# Patient Record
Sex: Female | Born: 1950 | Race: White | Hispanic: No | Marital: Married | Smoking: Former smoker
Health system: Southern US, Community
[De-identification: ages and names within clinical notes are randomized; demographics above are authoritative.]

## PROBLEM LIST (undated history)

## (undated) DIAGNOSIS — T7840XA Allergy, unspecified, initial encounter: Secondary | ICD-10-CM

## (undated) DIAGNOSIS — I499 Cardiac arrhythmia, unspecified: Secondary | ICD-10-CM

## (undated) DIAGNOSIS — M1990 Unspecified osteoarthritis, unspecified site: Secondary | ICD-10-CM

## (undated) DIAGNOSIS — R0989 Other specified symptoms and signs involving the circulatory and respiratory systems: Secondary | ICD-10-CM

## (undated) DIAGNOSIS — Z90710 Acquired absence of both cervix and uterus: Secondary | ICD-10-CM

## (undated) DIAGNOSIS — M8580 Other specified disorders of bone density and structure, unspecified site: Secondary | ICD-10-CM

## (undated) DIAGNOSIS — E079 Disorder of thyroid, unspecified: Secondary | ICD-10-CM

## (undated) DIAGNOSIS — I10 Essential (primary) hypertension: Secondary | ICD-10-CM

## (undated) HISTORY — DX: Essential (primary) hypertension: I10

## (undated) HISTORY — DX: Other specified disorders of bone density and structure, unspecified site: M85.80

## (undated) HISTORY — DX: Disorder of thyroid, unspecified: E07.9

## (undated) HISTORY — DX: Other specified symptoms and signs involving the circulatory and respiratory systems: R09.89

## (undated) HISTORY — DX: Cardiac arrhythmia, unspecified: I49.9

## (undated) HISTORY — DX: Unspecified osteoarthritis, unspecified site: M19.90

## (undated) HISTORY — DX: Allergy, unspecified, initial encounter: T78.40XA

## (undated) HISTORY — DX: Acquired absence of both cervix and uterus: Z90.710

---

## 2000-03-22 ENCOUNTER — Other Ambulatory Visit: Admission: RE | Admit: 2000-03-22 | Discharge: 2000-03-22 | Payer: Self-pay | Admitting: Obstetrics and Gynecology

## 2003-02-18 ENCOUNTER — Encounter: Admission: RE | Admit: 2003-02-18 | Discharge: 2003-02-18 | Payer: Self-pay | Admitting: Family Medicine

## 2003-09-10 ENCOUNTER — Encounter: Admission: RE | Admit: 2003-09-10 | Discharge: 2003-09-10 | Payer: Self-pay | Admitting: Family Medicine

## 2003-09-22 ENCOUNTER — Encounter: Admission: RE | Admit: 2003-09-22 | Discharge: 2003-09-22 | Payer: Self-pay | Admitting: Family Medicine

## 2004-12-14 ENCOUNTER — Encounter: Admission: RE | Admit: 2004-12-14 | Discharge: 2004-12-14 | Payer: Self-pay | Admitting: Family Medicine

## 2006-03-06 ENCOUNTER — Encounter (HOSPITAL_COMMUNITY): Admission: RE | Admit: 2006-03-06 | Discharge: 2006-06-04 | Payer: Self-pay | Admitting: Family Medicine

## 2006-05-02 ENCOUNTER — Encounter: Admission: RE | Admit: 2006-05-02 | Discharge: 2006-05-02 | Payer: Self-pay | Admitting: Endocrinology

## 2008-08-27 ENCOUNTER — Encounter: Admission: RE | Admit: 2008-08-27 | Discharge: 2008-08-27 | Payer: Self-pay | Admitting: Family Medicine

## 2010-02-27 ENCOUNTER — Encounter: Payer: Self-pay | Admitting: Endocrinology

## 2010-09-08 ENCOUNTER — Other Ambulatory Visit: Payer: Self-pay | Admitting: Family Medicine

## 2010-09-08 DIAGNOSIS — M8580 Other specified disorders of bone density and structure, unspecified site: Secondary | ICD-10-CM

## 2010-09-08 DIAGNOSIS — Z1231 Encounter for screening mammogram for malignant neoplasm of breast: Secondary | ICD-10-CM

## 2010-09-20 ENCOUNTER — Ambulatory Visit
Admission: RE | Admit: 2010-09-20 | Discharge: 2010-09-20 | Disposition: A | Discharge status: Home / Self Care | Payer: BC Managed Care – PPO | Source: Ambulatory Visit | Attending: Family Medicine | Admitting: Family Medicine

## 2010-09-20 DIAGNOSIS — Z1231 Encounter for screening mammogram for malignant neoplasm of breast: Secondary | ICD-10-CM

## 2010-09-20 DIAGNOSIS — M8580 Other specified disorders of bone density and structure, unspecified site: Secondary | ICD-10-CM

## 2012-03-15 ENCOUNTER — Other Ambulatory Visit: Payer: Self-pay | Admitting: Family Medicine

## 2012-03-15 DIAGNOSIS — Z1231 Encounter for screening mammogram for malignant neoplasm of breast: Secondary | ICD-10-CM

## 2012-03-29 ENCOUNTER — Ambulatory Visit
Admission: RE | Admit: 2012-03-29 | Discharge: 2012-03-29 | Disposition: A | Discharge status: Home / Self Care | Payer: BC Managed Care – PPO | Source: Ambulatory Visit | Attending: Family Medicine | Admitting: Family Medicine

## 2012-03-29 DIAGNOSIS — Z1231 Encounter for screening mammogram for malignant neoplasm of breast: Secondary | ICD-10-CM

## 2012-04-06 HISTORY — PX: BREAST BIOPSY: SHX20

## 2012-04-08 ENCOUNTER — Other Ambulatory Visit: Payer: Self-pay | Admitting: Family Medicine

## 2012-04-08 DIAGNOSIS — R928 Other abnormal and inconclusive findings on diagnostic imaging of breast: Secondary | ICD-10-CM

## 2012-04-17 ENCOUNTER — Other Ambulatory Visit: Payer: Self-pay | Admitting: Family Medicine

## 2012-04-17 ENCOUNTER — Ambulatory Visit
Admission: RE | Admit: 2012-04-17 | Discharge: 2012-04-17 | Disposition: A | Discharge status: Home / Self Care | Payer: BC Managed Care – PPO | Source: Ambulatory Visit | Attending: Family Medicine | Admitting: Family Medicine

## 2012-04-17 DIAGNOSIS — R928 Other abnormal and inconclusive findings on diagnostic imaging of breast: Secondary | ICD-10-CM

## 2012-04-23 ENCOUNTER — Ambulatory Visit
Admission: RE | Admit: 2012-04-23 | Discharge: 2012-04-23 | Disposition: A | Discharge status: Home / Self Care | Payer: BC Managed Care – PPO | Source: Ambulatory Visit | Attending: Family Medicine | Admitting: Family Medicine

## 2012-04-23 ENCOUNTER — Other Ambulatory Visit: Payer: Self-pay | Admitting: Family Medicine

## 2012-04-23 DIAGNOSIS — R928 Other abnormal and inconclusive findings on diagnostic imaging of breast: Secondary | ICD-10-CM

## 2013-03-21 ENCOUNTER — Other Ambulatory Visit: Payer: Self-pay

## 2013-03-21 DIAGNOSIS — Z1231 Encounter for screening mammogram for malignant neoplasm of breast: Secondary | ICD-10-CM

## 2013-03-28 ENCOUNTER — Other Ambulatory Visit: Payer: Self-pay | Admitting: Family Medicine

## 2013-03-28 DIAGNOSIS — M8580 Other specified disorders of bone density and structure, unspecified site: Secondary | ICD-10-CM

## 2013-04-02 ENCOUNTER — Ambulatory Visit: Payer: BC Managed Care – PPO

## 2013-04-14 ENCOUNTER — Other Ambulatory Visit: Payer: BC Managed Care – PPO

## 2013-04-14 ENCOUNTER — Ambulatory Visit: Payer: BC Managed Care – PPO

## 2013-04-30 ENCOUNTER — Ambulatory Visit
Admission: RE | Admit: 2013-04-30 | Discharge: 2013-04-30 | Disposition: A | Discharge status: Home / Self Care | Payer: BC Managed Care – PPO | Source: Ambulatory Visit | Attending: Family Medicine | Admitting: Family Medicine

## 2013-04-30 ENCOUNTER — Ambulatory Visit
Admission: RE | Admit: 2013-04-30 | Discharge: 2013-04-30 | Disposition: A | Discharge status: Home / Self Care | Payer: BC Managed Care – PPO | Source: Ambulatory Visit

## 2013-04-30 DIAGNOSIS — Z1231 Encounter for screening mammogram for malignant neoplasm of breast: Secondary | ICD-10-CM

## 2013-04-30 DIAGNOSIS — M8580 Other specified disorders of bone density and structure, unspecified site: Secondary | ICD-10-CM

## 2014-04-29 ENCOUNTER — Other Ambulatory Visit: Payer: Self-pay

## 2014-04-29 DIAGNOSIS — Z1231 Encounter for screening mammogram for malignant neoplasm of breast: Secondary | ICD-10-CM

## 2014-05-04 ENCOUNTER — Ambulatory Visit
Admission: RE | Admit: 2014-05-04 | Discharge: 2014-05-04 | Disposition: A | Discharge status: Home / Self Care | Payer: BLUE CROSS/BLUE SHIELD | Source: Ambulatory Visit

## 2014-05-04 DIAGNOSIS — Z1231 Encounter for screening mammogram for malignant neoplasm of breast: Secondary | ICD-10-CM

## 2014-09-16 ENCOUNTER — Other Ambulatory Visit: Payer: Self-pay | Admitting: Physician Assistant

## 2014-09-16 ENCOUNTER — Ambulatory Visit
Admission: RE | Admit: 2014-09-16 | Discharge: 2014-09-16 | Disposition: A | Discharge status: Home / Self Care | Payer: BLUE CROSS/BLUE SHIELD | Source: Ambulatory Visit | Attending: Physician Assistant | Admitting: Physician Assistant

## 2014-09-16 DIAGNOSIS — W5501XA Bitten by cat, initial encounter: Secondary | ICD-10-CM

## 2014-09-29 ENCOUNTER — Encounter (HOSPITAL_COMMUNITY): Payer: Self-pay | Admitting: *Deleted

## 2014-09-29 ENCOUNTER — Emergency Department (HOSPITAL_COMMUNITY)
Admission: EM | Admit: 2014-09-29 | Discharge: 2014-09-29 | Disposition: A | Discharge status: Home / Self Care | Payer: BLUE CROSS/BLUE SHIELD | Attending: Emergency Medicine | Admitting: Emergency Medicine

## 2014-09-29 DIAGNOSIS — E039 Hypothyroidism, unspecified: Secondary | ICD-10-CM | POA: Insufficient documentation

## 2014-09-29 DIAGNOSIS — M1990 Unspecified osteoarthritis, unspecified site: Secondary | ICD-10-CM | POA: Diagnosis not present

## 2014-09-29 DIAGNOSIS — I10 Essential (primary) hypertension: Secondary | ICD-10-CM | POA: Insufficient documentation

## 2014-09-29 DIAGNOSIS — R55 Syncope and collapse: Secondary | ICD-10-CM | POA: Insufficient documentation

## 2014-09-29 DIAGNOSIS — Z79899 Other long term (current) drug therapy: Secondary | ICD-10-CM | POA: Diagnosis not present

## 2014-09-29 DIAGNOSIS — Z7982 Long term (current) use of aspirin: Secondary | ICD-10-CM | POA: Insufficient documentation

## 2014-09-29 DIAGNOSIS — Z720 Tobacco use: Secondary | ICD-10-CM | POA: Insufficient documentation

## 2014-09-29 LAB — URINALYSIS, ROUTINE W REFLEX MICROSCOPIC
Bilirubin Urine: NEGATIVE
GLUCOSE, UA: NEGATIVE mg/dL
Hgb urine dipstick: NEGATIVE
Ketones, ur: NEGATIVE mg/dL
LEUKOCYTES UA: NEGATIVE
Nitrite: NEGATIVE
PROTEIN: NEGATIVE mg/dL
SPECIFIC GRAVITY, URINE: 1.007 (ref 1.005–1.030)
Urobilinogen, UA: 0.2 mg/dL (ref 0.0–1.0)
pH: 7.5 (ref 5.0–8.0)

## 2014-09-29 LAB — BASIC METABOLIC PANEL
ANION GAP: 9 (ref 5–15)
BUN: 8 mg/dL (ref 6–20)
CALCIUM: 9.7 mg/dL (ref 8.9–10.3)
CHLORIDE: 100 mmol/L — AB (ref 101–111)
CO2: 29 mmol/L (ref 22–32)
Creatinine, Ser: 0.61 mg/dL (ref 0.44–1.00)
GFR calc non Af Amer: >60 mL/min (ref >60)
Glucose, Bld: 107 mg/dL — ABNORMAL HIGH (ref 65–99)
POTASSIUM: 4.2 mmol/L (ref 3.5–5.1)
Sodium: 138 mmol/L (ref 135–145)

## 2014-09-29 LAB — CBC
HEMATOCRIT: 40.9 % (ref 36.0–46.0)
HEMOGLOBIN: 14.2 g/dL (ref 12.0–15.0)
MCH: 31.6 pg (ref 26.0–34.0)
MCHC: 34.7 g/dL (ref 30.0–36.0)
MCV: 90.9 fL (ref 78.0–100.0)
Platelets: 314 10*3/uL (ref 150–400)
RBC: 4.5 MIL/uL (ref 3.87–5.11)
RDW: 13.6 % (ref 11.5–15.5)
WBC: 5.4 10*3/uL (ref 4.0–10.5)

## 2014-09-29 LAB — CBG MONITORING, ED: Glucose-Capillary: 105 mg/dL — ABNORMAL HIGH (ref 65–99)

## 2014-09-29 NOTE — ED Notes (Signed)
Dr. Adela Lank at bedside with the patient and family.

## 2014-09-29 NOTE — ED Notes (Signed)
Pt states that she was sitting on the toilet and got lightheaded. Pt states that she laid down and started feeling better however when she stood up she felt lightheaded again. Pt reports generalized weakness and "shaky". Neuro intact in triage.

## 2014-09-29 NOTE — Discharge Instructions (Signed)
Near-Syncope Near-syncope (commonly known as near fainting) is sudden weakness, dizziness, or feeling like you might pass out. During an episode of near-syncope, you may also develop pale skin, have tunnel vision, or feel sick to your stomach (nauseous). Near-syncope may occur when getting up after sitting or while standing for a long time. It is caused by a sudden decrease in blood flow to the brain. This decrease can result from various causes or triggers, most of which are not serious. However, because near-syncope can sometimes be a sign of something serious, a medical evaluation is required. The specific cause is often not determined. HOME CARE INSTRUCTIONS  Monitor your condition for any changes. The following actions may help to alleviate any discomfort you are experiencing:  Have someone stay with you until you feel stable.  Lie down right away and prop your feet up if you start feeling like you might faint. Breathe deeply and steadily. Wait until all the symptoms have passed. Most of these episodes last only a few minutes. You may feel tired for several hours.   Drink enough fluids to keep your urine clear or pale yellow.   If you are taking blood pressure or heart medicine, get up slowly when seated or lying down. Take several minutes to sit and then stand. This can reduce dizziness.  Follow up with your health care provider as directed. SEEK IMMEDIATE MEDICAL CARE IF:   You have a severe headache.   You have unusual pain in the chest, abdomen, or back.   You are bleeding from the mouth or rectum, or you have black or tarry stool.   You have an irregular or very fast heartbeat.   You have repeated fainting or have seizure-like jerking during an episode.   You faint when sitting or lying down.   You have confusion.   You have difficulty walking.   You have severe weakness.   You have vision problems.  MAKE SURE YOU:   Understand these instructions.  Will  watch your condition.  Will get help right away if you are not doing well or get worse. Document Released: 01/23/2005 Document Revised: 01/28/2013 Document Reviewed: 06/28/2012 Exit[employer] Patient Information 2015 [employer]. This information is not intended to replace advice given to you by your health care provider. Make sure you discuss any questions you have with your health care provider.  

## 2014-09-29 NOTE — ED Provider Notes (Signed)
CSN: [id number]     Arrival date & time 09/29/14  1308 History   First MD Initiated Contact with Patient 09/29/14 [phone]     Chief Complaint  Patient presents with  . Near Syncope   HPI  64yo female presenting with near syncope. Denies any loss of consciousness. Noted first episode occurred two weeks ago and she went to her PCP's office; blood pressure was noted to be significantly elevated, but had improved prior to going home. Had recently fallen down a flight of stairs and hurt her ankle, so blood pressure was believed to be elevated because of this. Was started on Augmentin for infected cat bite on right arm and on Meloxicam for back and ankle pain follow fall. Did not like the way the medications made her feel, so she discontinued these only a few days after starting them. Denies any other episodes of weakness or dizziness, but husband states she told him several times over the last few weeks that she felt weak. This morning Lisa Owen was getting ready when she became very lightheaded, flushed and hot, and weak. She laid down and the symptoms seemed to improve, however every time she stood up the symptoms returned. Has not eaten yet this morning. States she has been taking her blood pressure several times a day since she was seen in her PCPs office and it has been in 120s-130s/70s. Also noted some nausea with today's episode, but denies vomiting.   Past Medical History  Diagnosis Date  . Hypertension   . Arrhythmia     tacycardia   . Thyroid disease     hypothyroidism  . Allergy   . Osteopenia   . Carotid bruit     doppler normal 09/2009  . Arthritis     (Thumb CMCs bil)  . Hx of hysterectomy     secondary endometriosis/ left oorphorectomy secondary endometriosis   History reviewed. No pertinent past surgical history. Family History  Problem Relation Age of Onset  . Arthritis Mother   . Lung cancer Father   . CVA Sister    Social History  Substance Use Topics  . Smoking status:  Current Some Day Smoker  . Smokeless tobacco: None  . Alcohol Use: No   OB History    No data available     Review of Systems    Allergies  Boniva; Clarinex; Fosamax; and Sudafed  Home Medications   Prior to Admission medications   Medication Sig Start Date End Date Taking? Authorizing Provider  aspirin EC 81 MG tablet Take 81 mg by mouth daily.    Historical Provider, MD  calcium gluconate 500 MG tablet Take 1 tablet by mouth 3 (three) times daily.    Historical Provider, MD  cholecalciferol (VITAMIN D) 1000 UNITS tablet Take 1,000 Units by mouth daily.    Historical Provider, MD  Fluticasone-Salmeterol (ADVAIR DISKUS IN) Inhale into the lungs. Twice a day    Historical Provider, MD  folic acid (FOLVITE) 400 MCG tablet Take 400 mcg by mouth daily.    Historical Provider, MD  guaiFENesin (MUCINEX) 600 MG 12 hr tablet Over the counter  As needed    Historical Provider, MD  hydrochlorothiazide (HYDRODIURIL) 25 MG tablet Take 25 mg by mouth daily.    Historical Provider, MD  Multiple Vitamins-Minerals (CENTRUM SILVER PO) Take by mouth. 1 tab daily    Historical Provider, MD  vitamin C (ASCORBIC ACID) 500 MG tablet Take 500 mg by mouth daily.    Historical Provider,  MD   BP 131/56 mmHg  Pulse 73  Temp(Src) 99.1 F (37.3 C) (Oral)  Resp 22  SpO2 94% Physical Exam  ED Course  Procedures (including critical care time) Labs Review Labs Reviewed  BASIC METABOLIC PANEL - Abnormal; Notable for the following:    Chloride 100 (*)    Glucose, Bld 107 (*)    All other components within normal limits  CBG MONITORING, ED - Abnormal; Notable for the following:    Glucose-Capillary 105 (*)    All other components within normal limits  CBC  URINALYSIS, ROUTINE W REFLEX MICROSCOPIC (NOT AT [facility])    Imaging Review No results found. I have personally reviewed and evaluated these images and lab results as part of my medical decision-making.   EKG Interpretation   Date/Time:   Tuesday September 29 2014 09:01:14 EDT Ventricular Rate:  83 PR Interval:  174 QRS Duration: 76 QT Interval:  362 QTC Calculation: 425 R Axis:   83 Text Interpretation:  Normal sinus rhythm Normal ECG No previous ECGs  available Confirmed by FLOYD MD, DANIEL ([id number]) on 09/29/2014 10:25:08 AM      MDM   Final diagnoses:  Near syncope  EKG with NSR. Urinalysis normal.  BMP normal. CBC normal. Repeat EKG with NSR. No orthostasis noted. Denies symptoms of lightheadedness prior to discharge. Stable for discharge with follow up with PCP.    [place], [place] 09/29/14 1101  Melene Plan, DO 09/29/14 1524

## 2014-09-29 NOTE — ED Notes (Signed)
CBG 105 

## 2015-04-28 ENCOUNTER — Other Ambulatory Visit: Payer: Self-pay | Admitting: Family Medicine

## 2015-04-28 DIAGNOSIS — Z1231 Encounter for screening mammogram for malignant neoplasm of breast: Secondary | ICD-10-CM

## 2015-04-28 DIAGNOSIS — M8580 Other specified disorders of bone density and structure, unspecified site: Secondary | ICD-10-CM

## 2015-05-20 ENCOUNTER — Ambulatory Visit: Payer: BLUE CROSS/BLUE SHIELD

## 2015-05-20 ENCOUNTER — Other Ambulatory Visit: Payer: BLUE CROSS/BLUE SHIELD

## 2015-05-27 ENCOUNTER — Ambulatory Visit
Admission: RE | Admit: 2015-05-27 | Discharge: 2015-05-27 | Disposition: A | Discharge status: Home / Self Care | Payer: BLUE CROSS/BLUE SHIELD | Source: Ambulatory Visit | Attending: Family Medicine | Admitting: Family Medicine

## 2015-05-27 DIAGNOSIS — Z780 Asymptomatic menopausal state: Secondary | ICD-10-CM | POA: Diagnosis not present

## 2015-05-27 DIAGNOSIS — Z1231 Encounter for screening mammogram for malignant neoplasm of breast: Secondary | ICD-10-CM | POA: Diagnosis not present

## 2015-05-27 DIAGNOSIS — M85852 Other specified disorders of bone density and structure, left thigh: Secondary | ICD-10-CM | POA: Diagnosis not present

## 2015-05-27 DIAGNOSIS — M8580 Other specified disorders of bone density and structure, unspecified site: Secondary | ICD-10-CM

## 2015-10-05 DIAGNOSIS — I10 Essential (primary) hypertension: Secondary | ICD-10-CM | POA: Diagnosis not present

## 2015-10-05 DIAGNOSIS — Z23 Encounter for immunization: Secondary | ICD-10-CM | POA: Diagnosis not present

## 2015-10-05 DIAGNOSIS — E559 Vitamin D deficiency, unspecified: Secondary | ICD-10-CM | POA: Diagnosis not present

## 2015-10-05 DIAGNOSIS — Z1211 Encounter for screening for malignant neoplasm of colon: Secondary | ICD-10-CM | POA: Diagnosis not present

## 2015-10-05 DIAGNOSIS — E7800 Pure hypercholesterolemia, unspecified: Secondary | ICD-10-CM | POA: Diagnosis not present

## 2015-10-05 DIAGNOSIS — E039 Hypothyroidism, unspecified: Secondary | ICD-10-CM | POA: Diagnosis not present

## 2015-10-05 DIAGNOSIS — E049 Nontoxic goiter, unspecified: Secondary | ICD-10-CM | POA: Diagnosis not present

## 2016-04-05 DIAGNOSIS — E7800 Pure hypercholesterolemia, unspecified: Secondary | ICD-10-CM | POA: Diagnosis not present

## 2016-04-05 DIAGNOSIS — E039 Hypothyroidism, unspecified: Secondary | ICD-10-CM | POA: Diagnosis not present

## 2016-04-05 DIAGNOSIS — E049 Nontoxic goiter, unspecified: Secondary | ICD-10-CM | POA: Diagnosis not present

## 2016-04-05 DIAGNOSIS — I10 Essential (primary) hypertension: Secondary | ICD-10-CM | POA: Diagnosis not present

## 2016-04-05 DIAGNOSIS — M8580 Other specified disorders of bone density and structure, unspecified site: Secondary | ICD-10-CM | POA: Diagnosis not present

## 2016-04-05 DIAGNOSIS — E559 Vitamin D deficiency, unspecified: Secondary | ICD-10-CM | POA: Diagnosis not present

## 2016-07-07 ENCOUNTER — Other Ambulatory Visit: Payer: Self-pay | Admitting: Family Medicine

## 2016-07-07 DIAGNOSIS — Z1231 Encounter for screening mammogram for malignant neoplasm of breast: Secondary | ICD-10-CM

## 2016-07-10 DIAGNOSIS — M545 Low back pain: Secondary | ICD-10-CM | POA: Diagnosis not present

## 2016-07-10 DIAGNOSIS — M5417 Radiculopathy, lumbosacral region: Secondary | ICD-10-CM | POA: Diagnosis not present

## 2016-07-11 DIAGNOSIS — M545 Low back pain: Secondary | ICD-10-CM | POA: Diagnosis not present

## 2016-07-11 DIAGNOSIS — M5417 Radiculopathy, lumbosacral region: Secondary | ICD-10-CM | POA: Diagnosis not present

## 2016-07-14 DIAGNOSIS — M545 Low back pain: Secondary | ICD-10-CM | POA: Diagnosis not present

## 2016-07-14 DIAGNOSIS — M5417 Radiculopathy, lumbosacral region: Secondary | ICD-10-CM | POA: Diagnosis not present

## 2016-07-17 DIAGNOSIS — M5417 Radiculopathy, lumbosacral region: Secondary | ICD-10-CM | POA: Diagnosis not present

## 2016-07-17 DIAGNOSIS — M545 Low back pain: Secondary | ICD-10-CM | POA: Diagnosis not present

## 2016-07-20 DIAGNOSIS — M545 Low back pain: Secondary | ICD-10-CM | POA: Diagnosis not present

## 2016-07-20 DIAGNOSIS — M5417 Radiculopathy, lumbosacral region: Secondary | ICD-10-CM | POA: Diagnosis not present

## 2016-07-24 DIAGNOSIS — M5417 Radiculopathy, lumbosacral region: Secondary | ICD-10-CM | POA: Diagnosis not present

## 2016-07-24 DIAGNOSIS — M545 Low back pain: Secondary | ICD-10-CM | POA: Diagnosis not present

## 2016-07-25 DIAGNOSIS — M5417 Radiculopathy, lumbosacral region: Secondary | ICD-10-CM | POA: Diagnosis not present

## 2016-07-25 DIAGNOSIS — M545 Low back pain: Secondary | ICD-10-CM | POA: Diagnosis not present

## 2016-07-27 ENCOUNTER — Ambulatory Visit
Admission: RE | Admit: 2016-07-27 | Discharge: 2016-07-27 | Disposition: A | Discharge status: Home / Self Care | Payer: BLUE CROSS/BLUE SHIELD | Source: Ambulatory Visit | Attending: Family Medicine | Admitting: Family Medicine

## 2016-07-27 DIAGNOSIS — Z1231 Encounter for screening mammogram for malignant neoplasm of breast: Secondary | ICD-10-CM | POA: Diagnosis not present

## 2016-08-04 DIAGNOSIS — M5417 Radiculopathy, lumbosacral region: Secondary | ICD-10-CM | POA: Diagnosis not present

## 2016-08-04 DIAGNOSIS — M545 Low back pain: Secondary | ICD-10-CM | POA: Diagnosis not present

## 2016-08-08 DIAGNOSIS — M545 Low back pain: Secondary | ICD-10-CM | POA: Diagnosis not present

## 2016-08-08 DIAGNOSIS — M5417 Radiculopathy, lumbosacral region: Secondary | ICD-10-CM | POA: Diagnosis not present

## 2016-08-22 DIAGNOSIS — M5417 Radiculopathy, lumbosacral region: Secondary | ICD-10-CM | POA: Diagnosis not present

## 2016-08-22 DIAGNOSIS — M545 Low back pain: Secondary | ICD-10-CM | POA: Diagnosis not present

## 2016-10-04 DIAGNOSIS — R7301 Impaired fasting glucose: Secondary | ICD-10-CM | POA: Diagnosis not present

## 2016-10-04 DIAGNOSIS — E039 Hypothyroidism, unspecified: Secondary | ICD-10-CM | POA: Diagnosis not present

## 2016-10-04 DIAGNOSIS — I10 Essential (primary) hypertension: Secondary | ICD-10-CM | POA: Diagnosis not present

## 2016-10-04 DIAGNOSIS — E049 Nontoxic goiter, unspecified: Secondary | ICD-10-CM | POA: Diagnosis not present

## 2016-10-04 DIAGNOSIS — E7800 Pure hypercholesterolemia, unspecified: Secondary | ICD-10-CM | POA: Diagnosis not present

## 2016-10-04 DIAGNOSIS — Z1211 Encounter for screening for malignant neoplasm of colon: Secondary | ICD-10-CM | POA: Diagnosis not present

## 2016-10-04 DIAGNOSIS — E559 Vitamin D deficiency, unspecified: Secondary | ICD-10-CM | POA: Diagnosis not present

## 2017-01-22 DIAGNOSIS — F411 Generalized anxiety disorder: Secondary | ICD-10-CM | POA: Diagnosis not present

## 2017-02-19 DIAGNOSIS — F411 Generalized anxiety disorder: Secondary | ICD-10-CM | POA: Diagnosis not present

## 2017-03-19 DIAGNOSIS — E559 Vitamin D deficiency, unspecified: Secondary | ICD-10-CM | POA: Diagnosis not present

## 2017-03-19 DIAGNOSIS — F411 Generalized anxiety disorder: Secondary | ICD-10-CM | POA: Diagnosis not present

## 2017-03-19 DIAGNOSIS — E039 Hypothyroidism, unspecified: Secondary | ICD-10-CM | POA: Diagnosis not present

## 2017-03-19 DIAGNOSIS — R7301 Impaired fasting glucose: Secondary | ICD-10-CM | POA: Diagnosis not present

## 2017-03-19 DIAGNOSIS — I10 Essential (primary) hypertension: Secondary | ICD-10-CM | POA: Diagnosis not present

## 2017-03-19 DIAGNOSIS — E049 Nontoxic goiter, unspecified: Secondary | ICD-10-CM | POA: Diagnosis not present

## 2017-09-10 ENCOUNTER — Other Ambulatory Visit: Payer: Self-pay | Admitting: Family Medicine

## 2017-09-10 DIAGNOSIS — Z1231 Encounter for screening mammogram for malignant neoplasm of breast: Secondary | ICD-10-CM

## 2017-09-26 DIAGNOSIS — Z23 Encounter for immunization: Secondary | ICD-10-CM | POA: Diagnosis not present

## 2017-09-26 DIAGNOSIS — E039 Hypothyroidism, unspecified: Secondary | ICD-10-CM | POA: Diagnosis not present

## 2017-09-26 DIAGNOSIS — R7301 Impaired fasting glucose: Secondary | ICD-10-CM | POA: Diagnosis not present

## 2017-09-26 DIAGNOSIS — E7800 Pure hypercholesterolemia, unspecified: Secondary | ICD-10-CM | POA: Diagnosis not present

## 2017-09-26 DIAGNOSIS — I10 Essential (primary) hypertension: Secondary | ICD-10-CM | POA: Diagnosis not present

## 2017-09-26 DIAGNOSIS — F411 Generalized anxiety disorder: Secondary | ICD-10-CM | POA: Diagnosis not present

## 2017-09-26 DIAGNOSIS — Z1211 Encounter for screening for malignant neoplasm of colon: Secondary | ICD-10-CM | POA: Diagnosis not present

## 2017-09-26 DIAGNOSIS — E559 Vitamin D deficiency, unspecified: Secondary | ICD-10-CM | POA: Diagnosis not present

## 2017-10-05 ENCOUNTER — Other Ambulatory Visit: Payer: Self-pay | Admitting: Family Medicine

## 2017-10-05 ENCOUNTER — Ambulatory Visit
Admission: RE | Admit: 2017-10-05 | Discharge: 2017-10-05 | Disposition: A | Discharge status: Home / Self Care | Payer: BLUE CROSS/BLUE SHIELD | Source: Ambulatory Visit | Attending: Family Medicine | Admitting: Family Medicine

## 2017-10-05 DIAGNOSIS — Z1231 Encounter for screening mammogram for malignant neoplasm of breast: Secondary | ICD-10-CM

## 2017-10-05 DIAGNOSIS — M8580 Other specified disorders of bone density and structure, unspecified site: Secondary | ICD-10-CM

## 2017-12-05 ENCOUNTER — Ambulatory Visit
Admission: RE | Admit: 2017-12-05 | Discharge: 2017-12-05 | Disposition: A | Discharge status: Home / Self Care | Payer: BLUE CROSS/BLUE SHIELD | Source: Ambulatory Visit | Attending: Family Medicine | Admitting: Family Medicine

## 2017-12-05 DIAGNOSIS — Z780 Asymptomatic menopausal state: Secondary | ICD-10-CM | POA: Diagnosis not present

## 2017-12-05 DIAGNOSIS — M8589 Other specified disorders of bone density and structure, multiple sites: Secondary | ICD-10-CM | POA: Diagnosis not present

## 2017-12-05 DIAGNOSIS — M8580 Other specified disorders of bone density and structure, unspecified site: Secondary | ICD-10-CM

## 2018-04-08 DIAGNOSIS — E049 Nontoxic goiter, unspecified: Secondary | ICD-10-CM | POA: Diagnosis not present

## 2018-04-08 DIAGNOSIS — F411 Generalized anxiety disorder: Secondary | ICD-10-CM | POA: Diagnosis not present

## 2018-04-08 DIAGNOSIS — R7301 Impaired fasting glucose: Secondary | ICD-10-CM | POA: Diagnosis not present

## 2018-04-08 DIAGNOSIS — I10 Essential (primary) hypertension: Secondary | ICD-10-CM | POA: Diagnosis not present

## 2018-04-08 DIAGNOSIS — E7800 Pure hypercholesterolemia, unspecified: Secondary | ICD-10-CM | POA: Diagnosis not present

## 2018-04-08 DIAGNOSIS — E559 Vitamin D deficiency, unspecified: Secondary | ICD-10-CM | POA: Diagnosis not present

## 2018-04-08 DIAGNOSIS — E039 Hypothyroidism, unspecified: Secondary | ICD-10-CM | POA: Diagnosis not present

## 2020-07-26 ENCOUNTER — Other Ambulatory Visit: Payer: Self-pay

## 2020-07-26 ENCOUNTER — Emergency Department (HOSPITAL_COMMUNITY): Payer: Medicare Other

## 2020-07-26 ENCOUNTER — Encounter (HOSPITAL_COMMUNITY): Payer: Self-pay | Admitting: *Deleted

## 2020-07-26 ENCOUNTER — Inpatient Hospital Stay (HOSPITAL_COMMUNITY)
Admission: EM | Admit: 2020-07-26 | Discharge: 2020-08-03 | DRG: 871 | Disposition: A | Discharge status: Home Health | Payer: Medicare Other | Attending: Hospitalist | Admitting: Hospitalist

## 2020-07-26 DIAGNOSIS — R0602 Shortness of breath: Secondary | ICD-10-CM

## 2020-07-26 DIAGNOSIS — Z79899 Other long term (current) drug therapy: Secondary | ICD-10-CM

## 2020-07-26 DIAGNOSIS — Z20822 Contact with and (suspected) exposure to covid-19: Secondary | ICD-10-CM | POA: Diagnosis present

## 2020-07-26 DIAGNOSIS — J4531 Mild persistent asthma with (acute) exacerbation: Secondary | ICD-10-CM | POA: Diagnosis present

## 2020-07-26 DIAGNOSIS — Z7951 Long term (current) use of inhaled steroids: Secondary | ICD-10-CM | POA: Diagnosis not present

## 2020-07-26 DIAGNOSIS — R6520 Severe sepsis without septic shock: Secondary | ICD-10-CM | POA: Diagnosis not present

## 2020-07-26 DIAGNOSIS — Z7722 Contact with and (suspected) exposure to environmental tobacco smoke (acute) (chronic): Secondary | ICD-10-CM | POA: Diagnosis present

## 2020-07-26 DIAGNOSIS — A419 Sepsis, unspecified organism: Secondary | ICD-10-CM | POA: Diagnosis not present

## 2020-07-26 DIAGNOSIS — Z803 Family history of malignant neoplasm of breast: Secondary | ICD-10-CM | POA: Diagnosis not present

## 2020-07-26 DIAGNOSIS — Z7982 Long term (current) use of aspirin: Secondary | ICD-10-CM | POA: Diagnosis not present

## 2020-07-26 DIAGNOSIS — J14 Pneumonia due to Hemophilus influenzae: Secondary | ICD-10-CM | POA: Diagnosis present

## 2020-07-26 DIAGNOSIS — F419 Anxiety disorder, unspecified: Secondary | ICD-10-CM | POA: Diagnosis present

## 2020-07-26 DIAGNOSIS — M8580 Other specified disorders of bone density and structure, unspecified site: Secondary | ICD-10-CM | POA: Diagnosis present

## 2020-07-26 DIAGNOSIS — Z801 Family history of malignant neoplasm of trachea, bronchus and lung: Secondary | ICD-10-CM

## 2020-07-26 DIAGNOSIS — I10 Essential (primary) hypertension: Secondary | ICD-10-CM | POA: Diagnosis present

## 2020-07-26 DIAGNOSIS — R7881 Bacteremia: Secondary | ICD-10-CM | POA: Diagnosis not present

## 2020-07-26 DIAGNOSIS — R0902 Hypoxemia: Secondary | ICD-10-CM | POA: Diagnosis not present

## 2020-07-26 DIAGNOSIS — J9602 Acute respiratory failure with hypercapnia: Secondary | ICD-10-CM | POA: Diagnosis present

## 2020-07-26 DIAGNOSIS — Z823 Family history of stroke: Secondary | ICD-10-CM

## 2020-07-26 DIAGNOSIS — Z8261 Family history of arthritis: Secondary | ICD-10-CM | POA: Diagnosis not present

## 2020-07-26 DIAGNOSIS — A413 Sepsis due to Hemophilus influenzae: Secondary | ICD-10-CM | POA: Diagnosis not present

## 2020-07-26 DIAGNOSIS — J45901 Unspecified asthma with (acute) exacerbation: Secondary | ICD-10-CM

## 2020-07-26 DIAGNOSIS — J9601 Acute respiratory failure with hypoxia: Secondary | ICD-10-CM

## 2020-07-26 DIAGNOSIS — E871 Hypo-osmolality and hyponatremia: Secondary | ICD-10-CM | POA: Diagnosis present

## 2020-07-26 DIAGNOSIS — J189 Pneumonia, unspecified organism: Secondary | ICD-10-CM | POA: Diagnosis present

## 2020-07-26 LAB — COMPREHENSIVE METABOLIC PANEL
ALT: 29 U/L (ref 0–44)
AST: 26 U/L (ref 15–41)
Albumin: 2.9 g/dL — ABNORMAL LOW (ref 3.5–5.0)
Alkaline Phosphatase: 98 U/L (ref 38–126)
Anion gap: 11 (ref 5–15)
BUN: 23 mg/dL (ref 8–23)
CO2: 27 mmol/L (ref 22–32)
Calcium: 9.1 mg/dL (ref 8.9–10.3)
Chloride: 91 mmol/L — ABNORMAL LOW (ref 98–111)
Creatinine, Ser: 0.82 mg/dL (ref 0.44–1.00)
GFR, Estimated: >60 mL/min (ref >60)
Glucose, Bld: 139 mg/dL — ABNORMAL HIGH (ref 70–99)
Potassium: 3.5 mmol/L (ref 3.5–5.1)
Sodium: 129 mmol/L — ABNORMAL LOW (ref 135–145)
Total Bilirubin: 0.8 mg/dL (ref 0.3–1.2)
Total Protein: 7.2 g/dL (ref 6.5–8.1)

## 2020-07-26 LAB — BLOOD GAS, VENOUS
Acid-Base Excess: 0.1 mmol/L (ref 0.0–2.0)
Acid-Base Excess: 3 mmol/L — ABNORMAL HIGH (ref 0.0–2.0)
Bicarbonate: 27.7 mmol/L (ref 20.0–28.0)
Bicarbonate: 30.5 mmol/L — ABNORMAL HIGH (ref 20.0–28.0)
Drawn by: 6344
FIO2: 100
O2 Saturation: 98.2 %
O2 Saturation: 56.1 %
Patient temperature: 36.8
Patient temperature: 37
pCO2, Ven: 77.2 mmHg (ref 44.0–60.0)
pCO2, Ven: 81.7 mmHg (ref 44.0–60.0)
pH, Ven: 7.179 — CL (ref 7.250–7.430)
pH, Ven: 7.197 — CL (ref 7.250–7.430)
pO2, Ven: 138 mmHg — ABNORMAL HIGH (ref 32.0–45.0)
pO2, Ven: 32.3 mmHg (ref 32.0–45.0)

## 2020-07-26 LAB — I-STAT CHEM 8, ED
BUN: 31 mg/dL — ABNORMAL HIGH (ref 8–23)
Calcium, Ion: 1.16 mmol/L (ref 1.15–1.40)
Chloride: 94 mmol/L — ABNORMAL LOW (ref 98–111)
Creatinine, Ser: 0.8 mg/dL (ref 0.44–1.00)
Glucose, Bld: 141 mg/dL — ABNORMAL HIGH (ref 70–99)
HCT: 42 % (ref 36.0–46.0)
Hemoglobin: 14.3 g/dL (ref 12.0–15.0)
Potassium: 3.5 mmol/L (ref 3.5–5.1)
Sodium: 128 mmol/L — ABNORMAL LOW (ref 135–145)
TCO2: 31 mmol/L (ref 22–32)

## 2020-07-26 LAB — CBC WITH DIFFERENTIAL/PLATELET
Abs Immature Granulocytes: 0 10*3/uL (ref 0.00–0.07)
Basophils Absolute: 0 10*3/uL (ref 0.0–0.1)
Basophils Relative: 0 %
Eosinophils Absolute: 0 10*3/uL (ref 0.0–0.5)
Eosinophils Relative: 0 %
HCT: 38.3 % (ref 36.0–46.0)
Hemoglobin: 13 g/dL (ref 12.0–15.0)
Lymphocytes Relative: 4 %
Lymphs Abs: 0.9 10*3/uL (ref 0.7–4.0)
MCH: 31.1 pg (ref 26.0–34.0)
MCHC: 33.9 g/dL (ref 30.0–36.0)
MCV: 91.6 fL (ref 80.0–100.0)
Monocytes Absolute: 3 10*3/uL — ABNORMAL HIGH (ref 0.1–1.0)
Monocytes Relative: 13 %
Neutro Abs: 18.9 10*3/uL — ABNORMAL HIGH (ref 1.7–7.7)
Neutrophils Relative %: 83 %
Platelets: 373 10*3/uL (ref 150–400)
RBC: 4.18 MIL/uL (ref 3.87–5.11)
RDW: 14.2 % (ref 11.5–15.5)
WBC: 22.8 10*3/uL — ABNORMAL HIGH (ref 4.0–10.5)
nRBC: 0 /100 WBC
nRBC: 0.1 % (ref 0.0–0.2)

## 2020-07-26 LAB — RESP PANEL BY RT-PCR (FLU A&B, COVID) ARPGX2
Influenza A by PCR: NEGATIVE
Influenza B by PCR: NEGATIVE
SARS Coronavirus 2 by RT PCR: NEGATIVE

## 2020-07-26 LAB — LACTIC ACID, PLASMA
Lactic Acid, Venous: 1.3 mmol/L (ref 0.5–1.9)
Lactic Acid, Venous: 1.4 mmol/L (ref 0.5–1.9)

## 2020-07-26 LAB — APTT: aPTT: 30 seconds (ref 24–36)

## 2020-07-26 LAB — POC SARS CORONAVIRUS 2 AG -  ED: SARSCOV2ONAVIRUS 2 AG: NEGATIVE

## 2020-07-26 LAB — HIV ANTIBODY (ROUTINE TESTING W REFLEX): HIV Screen 4th Generation wRfx: NONREACTIVE

## 2020-07-26 LAB — PROTIME-INR
INR: 1.1 (ref 0.8–1.2)
Prothrombin Time: 14.4 seconds (ref 11.4–15.2)

## 2020-07-26 LAB — BRAIN NATRIURETIC PEPTIDE: B Natriuretic Peptide: 338.8 pg/mL — ABNORMAL HIGH (ref 0.0–100.0)

## 2020-07-26 MED ORDER — ACETAMINOPHEN 650 MG RE SUPP: 650.0000 mg | Freq: Four times a day (QID) | RECTAL | Status: DC | PRN

## 2020-07-26 MED ORDER — ALBUTEROL SULFATE (2.5 MG/3ML) 0.083% IN NEBU
5.0000 mg | INHALATION_SOLUTION | Freq: Once | RESPIRATORY_TRACT | Status: AC
  Administered 2020-07-26: 5 mg via RESPIRATORY_TRACT
  Filled 2020-07-26: qty 6

## 2020-07-26 MED ORDER — ENOXAPARIN SODIUM 40 MG/0.4ML IJ SOSY
40.0000 mg | PREFILLED_SYRINGE | Freq: Every day | INTRAMUSCULAR | Status: DC
  Administered 2020-07-26 – 2020-08-03 (×9): 40 mg via SUBCUTANEOUS
  Filled 2020-07-26 (×9): qty 0.4

## 2020-07-26 MED ORDER — LACTATED RINGERS IV SOLN: INTRAVENOUS | Status: AC

## 2020-07-26 MED ORDER — ALBUTEROL SULFATE (2.5 MG/3ML) 0.083% IN NEBU: 2.5000 mg | INHALATION_SOLUTION | RESPIRATORY_TRACT | 2 refills | Status: DC | PRN

## 2020-07-26 MED ORDER — LEVALBUTEROL HCL 0.63 MG/3ML IN NEBU: 0.6300 mg | INHALATION_SOLUTION | Freq: Four times a day (QID) | RESPIRATORY_TRACT | Status: DC | PRN

## 2020-07-26 MED ORDER — GUAIFENESIN ER 600 MG PO TB12
1200.0000 mg | ORAL_TABLET | Freq: Two times a day (BID) | ORAL | Status: DC
  Administered 2020-07-27 – 2020-07-30 (×3): 1200 mg via ORAL
  Filled 2020-07-26 (×8): qty 2

## 2020-07-26 MED ORDER — SODIUM CHLORIDE 0.9 % IV SOLN
500.0000 mg | INTRAVENOUS | Status: DC
  Administered 2020-07-26 – 2020-07-27 (×2): 500 mg via INTRAVENOUS
  Filled 2020-07-26 (×3): qty 500

## 2020-07-26 MED ORDER — METHYLPREDNISOLONE SODIUM SUCC 125 MG IJ SOLR
125.0000 mg | Freq: Once | INTRAMUSCULAR | Status: AC
  Administered 2020-07-26: 125 mg via INTRAVENOUS
  Filled 2020-07-26: qty 2

## 2020-07-26 MED ORDER — CEFTRIAXONE SODIUM 2 G IJ SOLR
2.0000 g | INTRAMUSCULAR | Status: AC
  Administered 2020-07-26 – 2020-07-31 (×6): 2 g via INTRAVENOUS
  Filled 2020-07-26 (×2): qty 2
  Filled 2020-07-26: qty 20
  Filled 2020-07-26 (×3): qty 2
  Filled 2020-07-26: qty 20

## 2020-07-26 MED ORDER — PREDNISONE 20 MG PO TABS
40.0000 mg | ORAL_TABLET | Freq: Every day | ORAL | Status: AC
  Administered 2020-07-28 – 2020-07-31 (×4): 40 mg via ORAL
  Filled 2020-07-26 (×4): qty 2

## 2020-07-26 MED ORDER — FLUTICASONE-SALMETEROL 500-50 MCG/ACT IN AEPB: 1.0000 | INHALATION_SPRAY | Freq: Two times a day (BID) | RESPIRATORY_TRACT | Status: DC

## 2020-07-26 MED ORDER — METHYLPREDNISOLONE SODIUM SUCC 40 MG IJ SOLR
40.0000 mg | Freq: Four times a day (QID) | INTRAMUSCULAR | Status: AC
  Administered 2020-07-26 – 2020-07-27 (×4): 40 mg via INTRAVENOUS
  Filled 2020-07-26 (×4): qty 1

## 2020-07-26 MED ORDER — ONDANSETRON HCL 4 MG PO TABS: 4.0000 mg | ORAL_TABLET | Freq: Four times a day (QID) | ORAL | Status: DC | PRN

## 2020-07-26 MED ORDER — AMLODIPINE BESYLATE 5 MG PO TABS
5.0000 mg | ORAL_TABLET | Freq: Every day | ORAL | Status: DC
  Administered 2020-07-26 – 2020-07-29 (×4): 5 mg via ORAL
  Filled 2020-07-26 (×4): qty 1

## 2020-07-26 MED ORDER — IPRATROPIUM-ALBUTEROL 0.5-2.5 (3) MG/3ML IN SOLN
3.0000 mL | Freq: Four times a day (QID) | RESPIRATORY_TRACT | Status: DC
  Administered 2020-07-26 – 2020-07-27 (×3): 3 mL via RESPIRATORY_TRACT
  Filled 2020-07-26 (×3): qty 3

## 2020-07-26 MED ORDER — FLUTICASONE FUROATE-VILANTEROL 200-25 MCG/INH IN AEPB
1.0000 | INHALATION_SPRAY | Freq: Every day | RESPIRATORY_TRACT | Status: DC
  Administered 2020-07-27 – 2020-08-03 (×8): 1 via RESPIRATORY_TRACT
  Filled 2020-07-26: qty 28

## 2020-07-26 MED ORDER — ALBUTEROL SULFATE HFA 108 (90 BASE) MCG/ACT IN AERS: 2.0000 | INHALATION_SPRAY | Freq: Four times a day (QID) | RESPIRATORY_TRACT | 2 refills | Status: DC | PRN

## 2020-07-26 MED ORDER — CALCIUM GLUCONATE 500 MG PO TABS: 1.0000 | ORAL_TABLET | Freq: Three times a day (TID) | ORAL | Status: DC

## 2020-07-26 MED ORDER — FOLIC ACID 1 MG PO TABS
0.5000 mg | ORAL_TABLET | Freq: Every day | ORAL | Status: DC
  Administered 2020-07-26 – 2020-08-03 (×9): 0.5 mg via ORAL
  Filled 2020-07-26 (×9): qty 1

## 2020-07-26 MED ORDER — FOLIC ACID 400 MCG PO TABS: 400.0000 ug | ORAL_TABLET | Freq: Every day | ORAL | Status: DC

## 2020-07-26 MED ORDER — ASPIRIN EC 81 MG PO TBEC
81.0000 mg | DELAYED_RELEASE_TABLET | Freq: Every day | ORAL | Status: DC
  Administered 2020-07-26 – 2020-08-03 (×9): 81 mg via ORAL
  Filled 2020-07-26 (×9): qty 1

## 2020-07-26 MED ORDER — VITAMIN D 25 MCG (1000 UNIT) PO TABS
1000.0000 [IU] | ORAL_TABLET | Freq: Every day | ORAL | Status: DC
  Administered 2020-07-27 – 2020-08-03 (×8): 1000 [IU] via ORAL
  Filled 2020-07-26 (×8): qty 1

## 2020-07-26 MED ORDER — MAGNESIUM SULFATE 2 GM/50ML IV SOLN
2.0000 g | Freq: Once | INTRAVENOUS | Status: AC
  Administered 2020-07-26: 2 g via INTRAVENOUS
  Filled 2020-07-26: qty 50

## 2020-07-26 MED ORDER — HYDROCHLOROTHIAZIDE 25 MG PO TABS: 25.0000 mg | ORAL_TABLET | Freq: Every day | ORAL | Status: DC

## 2020-07-26 MED ORDER — ONDANSETRON HCL 4 MG/2ML IJ SOLN: 4.0000 mg | Freq: Four times a day (QID) | INTRAMUSCULAR | Status: DC | PRN

## 2020-07-26 MED ORDER — ACETAMINOPHEN 325 MG PO TABS
650.0000 mg | ORAL_TABLET | Freq: Four times a day (QID) | ORAL | Status: DC | PRN
  Administered 2020-07-27 – 2020-07-30 (×2): 650 mg via ORAL
  Filled 2020-07-26 (×2): qty 2

## 2020-07-26 NOTE — Sepsis Progress Note (Signed)
Sepsis protocol being followed by [facility] 

## 2020-07-26 NOTE — ED Triage Notes (Signed)
Patient presents to ED via [facility] states she hasn't been feeling good since Monday was seen by her PCP on Fri. And was given pneumonia shot. States this weekend starting feeling worse fever chills , upon ems arrival today patient had bilateral exp. Wheezing. Sats were in the mid 60's , patient was given 2 breathing treatments , states she was feeling a slight bit better.

## 2020-07-26 NOTE — Progress Notes (Signed)
Physician notified of patients VBG levels. Patient will continue with BiPAP therapy based on lab results. Patient educated and is compliant.

## 2020-07-26 NOTE — ED Provider Notes (Signed)
[facility] EMERGENCY DEPARTMENT Provider Note   CSN: [id number] Arrival date & time: 07/26/20  1003     History Chief Complaint  Patient presents with   Shortness of Breath    Lisa Owen is a 70 y.o. female presenting for evaluation of shortness of breath, cough.   Patient states she has not been feeling well for the past week.  She has had shortness of breath, cough, weakness, diarrhea.  She saw her PCP 3 days ago, received a pneumonia vaccine, even though she was not feeling well.  This weekend, her symptoms worsened.  She reports fever.  She denies chest pain, abdominal pain, urinary symptoms.  No leg pain or swelling.  She is not on blood thinners.  She denies sick contacts.  She is not vaccinated for COVID.  She does not wear oxygen at home, history of asthma and has been using her inhalers with mild improvement.  Additional history obtained from EMS.  Per EMS, on their arrival patient was hypoxic with a room air sat in the 60s.  She received 2 breathing treatments with EMS with mild improvement  Per chart review, history of arthritis, hypertension, hypothyroidism   HPI     Past Medical History:  Diagnosis Date   Allergy    Arrhythmia    tacycardia    Arthritis    (Thumb CMCs bil)   Carotid bruit    doppler normal 09/2009   Hx of hysterectomy    secondary endometriosis/ left oorphorectomy secondary endometriosis   Hypertension    Osteopenia    Thyroid disease    hypothyroidism    There are no problems to display for this patient.   Past Surgical History:  Procedure Laterality Date   BREAST BIOPSY Left 04/2012   Core      OB History   No obstetric history on file.     Family History  Problem Relation Age of Onset   Arthritis Mother    Lung cancer Father    CVA Sister    Breast cancer Maternal Aunt     Social History   Tobacco Use   Smoking status: Some Days    Pack years: 0.00  Substance Use Topics   Alcohol use: No     Home Medications Prior to Admission medications   Medication Sig Start Date End Date Taking? Authorizing Provider  aspirin EC 81 MG tablet Take 81 mg by mouth daily.    [provider]  calcium gluconate 500 MG tablet Take 1 tablet by mouth 3 (three) times daily.    [provider]  cholecalciferol (VITAMIN D) 1000 UNITS tablet Take 1,000 Units by mouth daily.    [provider]  Fluticasone-Salmeterol (ADVAIR DISKUS IN) Inhale into the lungs. Twice a day    [provider]  folic acid (FOLVITE) 400 MCG tablet Take 400 mcg by mouth daily.    [provider]  guaiFENesin (MUCINEX) 600 MG 12 hr tablet Over the counter  As needed    [provider]  hydrochlorothiazide (HYDRODIURIL) 25 MG tablet Take 25 mg by mouth daily.    [provider]  Multiple Vitamins-Minerals (CENTRUM SILVER PO) Take by mouth. 1 tab daily    [provider]  vitamin C (ASCORBIC ACID) 500 MG tablet Take 500 mg by mouth daily.    [provider]    Allergies    Boniva [ibandronic acid], Clarinex [desloratadine], Fosamax [alendronate sodium], and Sudafed [pseudoephedrine]  Review of  Systems   Review of Systems  Constitutional:  Positive for fever.  Respiratory:  Positive for cough and shortness of breath.   Cardiovascular:  Negative for chest pain.  Gastrointestinal:  Positive for diarrhea.  All other systems reviewed and are negative.  Physical Exam Updated Vital Signs BP (!) 149/70   Pulse (!) 104   Temp 98.5 F (36.9 C) (Temporal)   Resp (!) 26   Ht 5\' 5"  (1.651 m)   Wt 71.2 kg   SpO2 96%   BMI 26.13 kg/m   Physical Exam Vitals and nursing note reviewed.  Constitutional:      General: She is not in acute distress.    Appearance: Normal appearance. She is ill-appearing.     Comments: Appears ill  HENT:     Head: Normocephalic and atraumatic.  Eyes:     Conjunctiva/sclera: Conjunctivae normal.     Pupils: Pupils  are equal, round, and reactive to light.  Cardiovascular:     Rate and Rhythm: Normal rate and regular rhythm.     Pulses: Normal pulses.  Pulmonary:     Effort: Tachypnea present.     Breath sounds: Wheezing and rhonchi present.     Comments: On nonrebreather, sats of 93%. Speaking in short sentences.  Expiratory wheezing in all fields with rhonchi in the right lower lobe. Productive cough noted on exam. Abdominal:     General: There is no distension.     Palpations: Abdomen is soft. There is no mass.     Tenderness: There is no abdominal tenderness. There is no guarding or rebound.  Musculoskeletal:        General: Normal range of motion.     Cervical back: Normal range of motion and neck supple.     Right lower leg: No edema.     Left lower leg: No edema.  Skin:    General: Skin is warm and dry.     Capillary Refill: Capillary refill takes less than 2 seconds.  Neurological:     Mental Status: She is alert and oriented to person, place, and time.  Psychiatric:        Mood and Affect: Mood and affect normal.        Speech: Speech normal.        Behavior: Behavior normal.    ED Results / Procedures / Treatments   Labs (all labs ordered are listed, but only abnormal results are displayed) Labs Reviewed  CBC WITH DIFFERENTIAL/PLATELET - Abnormal; Notable for the following components:      Result Value   WBC 22.8 (*)    Neutro Abs 18.9 (*)    Monocytes Absolute 3.0 (*)    All other components within normal limits  COMPREHENSIVE METABOLIC PANEL - Abnormal; Notable for the following components:   Sodium 129 (*)    Chloride 91 (*)    Glucose, Bld 139 (*)    Albumin 2.9 (*)    All other components within normal limits  I-STAT CHEM 8, ED - Abnormal; Notable for the following components:   Sodium 128 (*)    Chloride 94 (*)    BUN 31 (*)    Glucose, Bld 141 (*)    All other components within normal limits  RESP PANEL BY RT-PCR (FLU A&B, COVID) ARPGX2  CULTURE, BLOOD  (ROUTINE X 2)  CULTURE, BLOOD (ROUTINE X 2)  URINE CULTURE  LACTIC ACID, PLASMA  LACTIC ACID, PLASMA  PROTIME-INR  APTT  URINALYSIS, ROUTINE W REFLEX MICROSCOPIC  BRAIN NATRIURETIC PEPTIDE  POC SARS CORONAVIRUS 2 AG -  ED    EKG EKG Interpretation  Date/Time:  Monday July 26 2020 10:47:18 EDT Ventricular Rate:  116 PR Interval:  180 QRS Duration: 81 QT Interval:  318 QTC Calculation: 431 R Axis:   -65 Text Interpretation: Sinus tachycardia Multiple premature complexes, vent & supraven Inferior infarct, old No significant change since last tracing Confirmed by Melene PlanFloyd, Dan [phone]([id number]) on 07/26/2020 11:06:53 AM  Radiology DG CHEST PORT 1 VIEW  Result Date: 07/26/2020 CLINICAL DATA:  Shortness of breath EXAM: PORTABLE CHEST 1 VIEW COMPARISON:  None. FINDINGS: Hazy right lower lobe airspace disease. Bilateral mild interstitial thickening. No pleural effusion or pneumothorax. Heart and mediastinal contours are unremarkable. No acute osseous abnormality. IMPRESSION: Right lower lobe airspace disease most concerning for pneumonia. Bilateral interstitial thickening can be seen in the setting of atypical viral pneumonia. Electronically Signed   By: Elige KoHetal  Patel   On: 07/26/2020 10:42    Procedures .Critical Care  Date/Time: 07/26/2020 12:00 PM Performed by: Alveria Apleyaccavale, Christyanna Mckeon, PA-C Authorized by: Alveria Apleyaccavale, Aftyn Nott, PA-C   Critical care provider statement:    Critical care time (minutes):  45   Critical care time was exclusive of:  Separately billable procedures and treating other patients and teaching time   Critical care was necessary to treat or prevent imminent or life-threatening deterioration of the following conditions:  Sepsis and respiratory failure   Critical care was time spent personally by me on the following activities:  Blood draw for specimens, development of treatment plan with patient or surrogate, evaluation of patient's response to treatment, examination of patient, obtaining  history from patient or surrogate, ordering and performing treatments and interventions, review of old charts, ordering and review of laboratory studies, ordering and review of radiographic studies, pulse oximetry and re-evaluation of patient's condition   I assumed direction of critical care for this patient from another provider in my specialty: no     Care discussed with: admitting provider   Comments:     Pt presenting meeting SIRS criteria and concern for PNA. Code sepsis called and pt admitted for IV abx.    Medications Ordered in ED Medications  lactated ringers infusion ( Intravenous New Bag/Given 07/26/20 1200)  cefTRIAXone (ROCEPHIN) 2 g in sodium chloride 0.9 % 100 mL IVPB (0 g Intravenous Stopped 07/26/20 1159)  azithromycin (ZITHROMAX) 500 mg in sodium chloride 0.9 % 250 mL IVPB (0 mg Intravenous Stopped 07/26/20 1306)  albuterol (PROVENTIL) (2.5 MG/3ML) 0.083% nebulizer solution 5 mg (has no administration in time range)  magnesium sulfate IVPB 2 g 50 mL (2 g Intravenous New Bag/Given 07/26/20 1309)  methylPREDNISolone sodium succinate (SOLU-MEDROL) 125 mg/2 mL injection 125 mg (125 mg Intravenous Given 07/26/20 1053)    ED Course  I have reviewed the triage vital signs and the nursing notes.  Pertinent labs & imaging results that were available during my care of the patient were reviewed by me and considered in my medical decision making (see chart for details).    MDM Rules/Calculators/A&P                          Patient presented for evaluation of cough, shortness of breath, fever.  On exam, patient appears ill.  She has sats in the low 90s on a nonrebreather.  Tachycardic and warm to the touch.  Concern for infection.  As patient was hypoxic to the 60s on room air, we will keep  her on oxygen.  Code sepsis called with a likely pulmonary source.  Also consider asthma exacerbation.  Consider PE, though less likely as patient does not have chest pain or signs of a DVT.  Chest  x-ray viewed and independently interpreted by me, consistent with pneumonia.  Per radiology, also consider viral cause.  Labs interpreted by me, shows leukocytosis of 22, consistent with sepsis.  Lactic is normal. Pt with hyponatremia and hypochloremia, likely due to dehydration.  On reevaluation, patient reports symptoms are improving.  Heart rate improved on my evaluation.  Sepsis reassessment performed.  Rapid COVID-negative, PCR is pending.  Will call for admission.  Discussed with Dr. Chipper Herb from [facility] hospitalist service, patient to be admitted.  Final Clinical Impression(s) / ED Diagnoses Final diagnoses:  SOB (shortness of breath)  Sepsis with acute hypoxic respiratory failure without septic shock, due to unspecified organism [facility])    Rx / DC Orders ED Discharge Orders     None        Alveria Apley, PA-C 07/26/20 1310    Melene Plan, DO 07/26/20 1332

## 2020-07-26 NOTE — Progress Notes (Signed)
VBG PH 7.17 and pCO2 77, went to see patient still looks sleepy still on ventri mask. D/W nursing and called and left message to Respiratory about setting up BIPAP ASAP. Repeat VBG in 3 hours ordered.

## 2020-07-26 NOTE — ED Notes (Signed)
Gilmer Mor daughter [phone] requesting an update on the patient

## 2020-07-26 NOTE — ED Notes (Signed)
States she is feeling better husband at bedside.

## 2020-07-26 NOTE — Progress Notes (Signed)
RT stopped by RN at nurses station. RN stated that pt does not appear well and working to breathe. Recommends RT assess pt. RT at bedside to find pt on 15L NRB with obvious SOB. Per MD order, pt was placed on BiPAP. Pt, along with family, was informed on this. Pt tolerating well with SVS at this time. RT will continue to monitor pt.

## 2020-07-26 NOTE — H&P (Signed)
History and Physical    Lisa Owen IWL:[id number] DOB: 1950-08-12 DOA: 07/26/2020  PCP: Blair Heys, MD (Confirm with patient/family/NH records and if not entered, this has to be entered at [facility] point of entry) Patient coming from: Home  I have personally briefly reviewed patient's old medical records in [facility]  Chief Complaint: Wheezing cough, SOB  HPI: Lisa Owen is a 70 y.o. female with medical history significant of mild persistent asthma, HTN, osteopenia, presented with asthma-like symptoms.  Patient went to see her PCP on Friday, received pneumonia shot.  On Saturday, patient started to have productive cough, wheezing after exposed to pollen/grass.  She used Advair, without significant improvement.  Her symptoms continue to to the weekend and last night, patient could not sleep because of severe shortness of breath and wheezing, she also experienced episodes of chills but no fever.  Her sputum has been thick and light yellowish.  Denies any chest pain no abdominal pain no urine problems or diarrhea.  Husband called EMS this morning, EMS arrived on the patient was found to have deep hypoxia O2 saturation in the 70s, ED Course: High flow oxygen applied, O2 saturation stabilized on 97%.  Chest x-ray showed right lower lobe infiltrate suspicious for pneumonia.  Bilateral interstitial thickening concerning about atypical pneumonia.  WBC 22 and left shift. Review of Systems: As per HPI otherwise 14 point review of systems negative.    Past Medical History:  Diagnosis Date   Allergy    Arrhythmia    tacycardia    Arthritis    (Thumb CMCs bil)   Carotid bruit    doppler normal 09/2009   Hx of hysterectomy    secondary endometriosis/ left oorphorectomy secondary endometriosis   Hypertension    Osteopenia    Thyroid disease    hypothyroidism    Past Surgical History:  Procedure Laterality Date   BREAST BIOPSY Left 04/2012   Core      reports that she has been  smoking. She does not have any smokeless tobacco history on file. She reports that she does not drink alcohol. No history on file for drug use.  Allergies  Allergen Reactions   Boniva [Ibandronic Acid]     Aches and GI upset   Clarinex [Desloratadine]    Fosamax [Alendronate Sodium]    Sudafed [Pseudoephedrine]     Hypertension Tachycardia     Family History  Problem Relation Age of Onset   Arthritis Mother    Lung cancer Father    CVA Sister    Breast cancer Maternal Aunt      Prior to Admission medications   Medication Sig Start Date End Date Taking? Authorizing Provider  aspirin EC 81 MG tablet Take 81 mg by mouth daily.    [provider]  calcium gluconate 500 MG tablet Take 1 tablet by mouth 3 (three) times daily.    [provider]  cholecalciferol (VITAMIN D) 1000 UNITS tablet Take 1,000 Units by mouth daily.    [provider]  Fluticasone-Salmeterol (ADVAIR DISKUS IN) Inhale into the lungs. Twice a day    [provider]  folic acid (FOLVITE) 400 MCG tablet Take 400 mcg by mouth daily.    [provider]  guaiFENesin (MUCINEX) 600 MG 12 hr tablet Over the counter  As needed    [provider]  hydrochlorothiazide (HYDRODIURIL) 25 MG tablet Take 25 mg by mouth daily.    [provider]  Multiple Vitamins-Minerals (CENTRUM SILVER  PO) Take by mouth. 1 tab daily    [provider]  vitamin C (ASCORBIC ACID) 500 MG tablet Take 500 mg by mouth daily.    [provider]    Physical Exam: Vitals:   07/26/20 1024 07/26/20 1045 07/26/20 1115 07/26/20 1145  BP:  (!) 149/80 (!) 145/76 (!) 149/70  Pulse:  (!) 112 (!) 105 (!) 104  Resp:  (!) 27 (!) 26 (!) 26  Temp:      TempSrc:      SpO2:  94% 93% 96%  Weight: 71.2 kg     Height: 5\' 5"  (1.651 m)       Constitutional: NAD, calm, comfortable Vitals:   07/26/20 1024 07/26/20 1045 07/26/20 1115 07/26/20 1145  BP:  (!) 149/80 (!) 145/76 (!)  149/70  Pulse:  (!) 112 (!) 105 (!) 104  Resp:  (!) 27 (!) 26 (!) 26  Temp:      TempSrc:      SpO2:  94% 93% 96%  Weight: 71.2 kg     Height: 5\' 5"  (1.651 m)      Eyes: PERRL, lids and conjunctivae normal ENMT: Mucous membranes are moist. Posterior pharynx clear of any exudate or lesions.Normal dentition.  Neck: normal, supple, no masses, no thyromegaly Respiratory: Diminished breathing sound bilaterally, diffused wheezing, scattered crackles.  Increasing respiratory effort, talking in broken sentences, positive signs of accessory muscle use.  Cardiovascular: Regular rate and rhythm, no murmurs / rubs / gallops. No extremity edema. 2+ pedal pulses. No carotid bruits.  Abdomen: no tenderness, no masses palpated. No hepatosplenomegaly. Bowel sounds positive.  Musculoskeletal: no clubbing / cyanosis. No joint deformity upper and lower extremities. Good ROM, no contractures. Normal muscle tone.  Skin: no rashes, lesions, ulcers. No induration Neurologic: CN 2-12 grossly intact. Sensation intact, DTR normal. Strength 5/5 in all 4.  Psychiatric: Normal judgment and insight. Alert and oriented x 3. Normal mood.    Labs on Admission: I have personally reviewed following labs and imaging studies  CBC: Recent Labs  Lab 07/26/20 1058 07/26/20 1127  WBC 22.8*  --   NEUTROABS 18.9*  --   HGB 13.0 14.3  HCT 38.3 42.0  MCV 91.6  --   PLT 373  --    Basic Metabolic Panel: Recent Labs  Lab 07/26/20 1058 07/26/20 1127  NA 129* 128*  K 3.5 3.5  CL 91* 94*  CO2 27  --   GLUCOSE 139* 141*  BUN 23 31*  CREATININE 0.82 0.80  CALCIUM 9.1  --    GFR: Estimated Creatinine Clearance: 66.6 mL/min (by C-G formula based on SCr of 0.8 mg/dL). Liver Function Tests: Recent Labs  Lab 07/26/20 1058  AST 26  ALT 29  ALKPHOS 98  BILITOT 0.8  PROT 7.2  ALBUMIN 2.9*   No results for input(s): LIPASE, AMYLASE in the last 168 hours. No results for input(s): AMMONIA in the last 168  hours. Coagulation Profile: Recent Labs  Lab 07/26/20 1058  INR 1.1   Cardiac Enzymes: No results for input(s): CKTOTAL, CKMB, CKMBINDEX, TROPONINI in the last 168 hours. BNP (last 3 results) No results for input(s): PROBNP in the last 8760 hours. HbA1C: No results for input(s): HGBA1C in the last 72 hours. CBG: No results for input(s): GLUCAP in the last 168 hours. Lipid Profile: No results for input(s): CHOL, HDL, LDLCALC, TRIG, CHOLHDL, LDLDIRECT in the last 72 hours. Thyroid Function Tests: No results for input(s): TSH, T4TOTAL, FREET4, T3FREE, THYROIDAB in the  last 72 hours. Anemia Panel: No results for input(s): VITAMINB12, FOLATE, FERRITIN, TIBC, IRON, RETICCTPCT in the last 72 hours. Urine analysis:    Component Value Date/Time   COLORURINE YELLOW 09/29/2014 1008   APPEARANCEUR CLEAR 09/29/2014 1008   LABSPEC 1.007 09/29/2014 1008   PHURINE 7.5 09/29/2014 1008   GLUCOSEU NEGATIVE 09/29/2014 1008   HGBUR NEGATIVE 09/29/2014 1008   BILIRUBINUR NEGATIVE 09/29/2014 1008   KETONESUR NEGATIVE 09/29/2014 1008   PROTEINUR NEGATIVE 09/29/2014 1008   UROBILINOGEN 0.2 09/29/2014 1008   NITRITE NEGATIVE 09/29/2014 1008   LEUKOCYTESUR NEGATIVE 09/29/2014 1008    Radiological Exams on Admission: DG CHEST PORT 1 VIEW  Result Date: 07/26/2020 CLINICAL DATA:  Shortness of breath EXAM: PORTABLE CHEST 1 VIEW COMPARISON:  None. FINDINGS: Hazy right lower lobe airspace disease. Bilateral mild interstitial thickening. No pleural effusion or pneumothorax. Heart and mediastinal contours are unremarkable. No acute osseous abnormality. IMPRESSION: Right lower lobe airspace disease most concerning for pneumonia. Bilateral interstitial thickening can be seen in the setting of atypical viral pneumonia. Electronically Signed   By: Elige Ko   On: 07/26/2020 10:42    EKG: Independently reviewed. Sinus tachy  Assessment/Plan Active Problems:   Hypoxia   Pneumonia   PNA (pneumonia)   (please populate well all problems here in Problem List. (For example, if patient is on BP meds at home and you resume or decide to hold them, it is a problem that needs to be her. Same for CAD, COPD, HLD and so on)  Acute hypoxic respite failure -Likely combination of acute asthma exacerbation plus community acquired pneumonia bacterial likely.  Atypical pneumonia cannot rule out.  Send atypical pneumonia study including Legionella and mycoplasma, sputum culture.  Continue ceftriaxone and Zithromax treatment.  Acute asthma exacerbation -Start duo nebs, with as needed DuoNebs, IV steroid bridging for p.o. prednisone.  Antibiotics as above. -Peak flow -Check VBG to rule out CO2 retention -BIPAP to relieve the breathing effort, admit to PCU.  Hyponatremia -Change HCTZ to amlodipine for now.  Hypertension -Change HCTZ to amlodipine for now.  DVT prophylaxis: Lovenox Code Status: Full code Family Communication: Husband at bedside Disposition Plan: Expect more than 2 midnight hospital stay to wean off oxygen and treat pneumonia and asthma exacerbation. Consults called: None Admission status: PCU   Emeline General MD [facility] Pager [phone]  07/26/2020, 1:26 PM

## 2020-07-27 DIAGNOSIS — R0902 Hypoxemia: Secondary | ICD-10-CM

## 2020-07-27 LAB — BASIC METABOLIC PANEL
Anion gap: 9 (ref 5–15)
BUN: 29 mg/dL — ABNORMAL HIGH (ref 8–23)
CO2: 29 mmol/L (ref 22–32)
Calcium: 9 mg/dL (ref 8.9–10.3)
Chloride: 92 mmol/L — ABNORMAL LOW (ref 98–111)
Creatinine, Ser: 0.98 mg/dL (ref 0.44–1.00)
GFR, Estimated: >60 mL/min (ref >60)
Glucose, Bld: 143 mg/dL — ABNORMAL HIGH (ref 70–99)
Potassium: 3.7 mmol/L (ref 3.5–5.1)
Sodium: 130 mmol/L — ABNORMAL LOW (ref 135–145)

## 2020-07-27 LAB — BLOOD GAS, ARTERIAL
Acid-Base Excess: 3 mmol/L — ABNORMAL HIGH (ref 0.0–2.0)
Bicarbonate: 28.8 mmol/L — ABNORMAL HIGH (ref 20.0–28.0)
FIO2: 40
O2 Saturation: 94.4 %
Patient temperature: 36.5
pCO2 arterial: 57.9 mmHg — ABNORMAL HIGH (ref 32.0–48.0)
pH, Arterial: 7.314 — ABNORMAL LOW (ref 7.350–7.450)
pO2, Arterial: 68.7 mmHg — ABNORMAL LOW (ref 83.0–108.0)

## 2020-07-27 LAB — CBC
HCT: 34.7 % — ABNORMAL LOW (ref 36.0–46.0)
Hemoglobin: 11.7 g/dL — ABNORMAL LOW (ref 12.0–15.0)
MCH: 31.1 pg (ref 26.0–34.0)
MCHC: 33.7 g/dL (ref 30.0–36.0)
MCV: 92.3 fL (ref 80.0–100.0)
Platelets: 317 10*3/uL (ref 150–400)
RBC: 3.76 MIL/uL — ABNORMAL LOW (ref 3.87–5.11)
RDW: 14.3 % (ref 11.5–15.5)
WBC: 17.6 10*3/uL — ABNORMAL HIGH (ref 4.0–10.5)
nRBC: 0 % (ref 0.0–0.2)

## 2020-07-27 LAB — MYCOPLASMA PNEUMONIAE ANTIBODY, IGM: Mycoplasma pneumo IgM: <770 U/mL (ref 0–769)

## 2020-07-27 MED ORDER — ADULT MULTIVITAMIN W/MINERALS CH
1.0000 | ORAL_TABLET | Freq: Every day | ORAL | Status: DC
  Administered 2020-07-27 – 2020-08-03 (×8): 1 via ORAL
  Filled 2020-07-27 (×8): qty 1

## 2020-07-27 MED ORDER — IPRATROPIUM-ALBUTEROL 0.5-2.5 (3) MG/3ML IN SOLN
3.0000 mL | Freq: Two times a day (BID) | RESPIRATORY_TRACT | Status: DC
  Administered 2020-07-27 – 2020-08-03 (×15): 3 mL via RESPIRATORY_TRACT
  Filled 2020-07-27 (×14): qty 3

## 2020-07-27 MED ORDER — BOOST PLUS PO LIQD
237.0000 mL | ORAL | Status: DC
  Administered 2020-07-27 – 2020-08-03 (×3): 237 mL via ORAL
  Filled 2020-07-27 (×8): qty 237

## 2020-07-27 NOTE — Evaluation (Signed)
Occupational Therapy Evaluation Patient Details Name: Lisa Owen MRN: [id number] DOB: 09-17-1950 Today's Date: 07/27/2020    History of Present Illness 70 y.o. female presenting to ED on 6/20 with worsening SOB, productive cough and wheezing. SpO2 in 70's when EMS arrived. Patient admitted with PNA, acute hypoxic respiratory failure and acute asthma exacerbation. PMHx significant for mild persistent asthma, HTN and osteopenia.   Clinical Impression   PTA patient was living with her spouse in a private residence and was grossly I with ADLs/IADLs without AD. Patient currently functioning below baseline demonstrating observed ADLs with increased time/effort including toileting/hygiene/clothing management and grooming standing at sink level with supervision A for line management and cues for activity pacing and energy conservation. Patient also limited by deficits listed below including decreased activity tolerance and would benefit from continued acute OT services in prep for safe d/c home.     Follow Up Recommendations  No OT follow up;Supervision - Intermittent    Equipment Recommendations  None recommended by OT (Patient has necessary DME)    Recommendations for Other Services       Precautions / Restrictions Precautions Precautions: Other (comment) Precaution Comments: Monitor vitals Restrictions Weight Bearing Restrictions: No      Mobility Bed Mobility Overal bed mobility: Modified Independent             General bed mobility comments: Increased time/effort secondary to SOB. Cues for pacing.    Transfers Overall transfer level: Needs assistance Equipment used: None Transfers: Sit to/from Stand Sit to Stand: Supervision         General transfer comment: Supervision A for line management only.    Balance Overall balance assessment: Mild deficits observed, not formally tested                                         ADL either performed or  assessed with clinical judgement   ADL Overall ADL's : Needs assistance/impaired     Grooming: Supervision/safety;Standing Grooming Details (indicate cue type and reason): Supervision A for hand hygiene standing at sink level for line management only.                 Toilet Transfer: Firefighter Details (indicate cue type and reason): To BSC over standard toilet with supervision A for line management. Toileting- Clothing Manipulation and Hygiene: Supervision/safety;Sit to/from stand Toileting - Clothing Manipulation Details (indicate cue type and reason): Supervision A for line management. Cues for activity pacing and energy conservation.     Functional mobility during ADLs: Supervision/safety General ADL Comments: Patient greatly limited by SOB and decreased activity tolernace.     Vision   Vision Assessment?: No apparent visual deficits     Perception     Praxis      Pertinent Vitals/Pain Pain Assessment: No/denies pain     Hand Dominance Right   Extremity/Trunk Assessment Upper Extremity Assessment Upper Extremity Assessment: Overall WFL for tasks assessed   Lower Extremity Assessment Lower Extremity Assessment: Defer to PT evaluation   Cervical / Trunk Assessment Cervical / Trunk Assessment: Normal   Communication Communication Communication: No difficulties   Cognition Arousal/Alertness: Awake/alert Behavior During Therapy: WFL for tasks assessed/performed Overall Cognitive Status: Within Functional Limits for tasks assessed  General Comments  SpO2 94% on 5L and HR 102bpm upon entry. SpO2 86% on 6L with portable tank and HR 125 Max. Titrated up to 8L with SpO2 improving to 92%. Patient returned to 6L at conclusion of session wth SpO2 93%.    Exercises     Shoulder Instructions      Home Living Family/patient expects to be discharged to:: Private residence Living  Arrangements: Spouse/significant other Available Help at Discharge: Family;Available 24 hours/day Type of Home: House Home Access: Stairs to enter [employer] of Steps: 2 Entrance Stairs-Rails: None Home Layout: One level     Bathroom Shower/Tub: Chief Strategy Officer: Standard     Home Equipment: Environmental consultant - 2 wheels;Crutches;Cane - single point;Bedside commode;Transport chair          Prior Functioning/Environment Level of Independence: Independent        Comments: I with ADLs/IADL; drives        OT Problem List: Decreased activity tolerance;Cardiopulmonary status limiting activity      OT Treatment/Interventions: Self-care/ADL training;Therapeutic exercise;Energy conservation;DME and/or AE instruction;Therapeutic activities;Patient/family education    OT Goals(Current goals can be found in the care plan section) Acute Rehab OT Goals Patient Stated Goal: To feel better. OT Goal Formulation: With patient Time For Goal Achievement: 08/10/20 Potential to Achieve Goals: Good ADL Goals Additional ADL Goal #1: Patient will complete ADLs with Mod I and good carryover of energy conservation techniques. Additional ADL Goal #2: Patient will tolerate 15 minutes of therapeutic activity without need for rest break indicating increased activity tolerance. Additional ADL Goal #3: Patient will recall and demo 3 energy conservation techniques in prep for ADLs.  OT Frequency: Min 2X/week   Barriers to D/C:            Co-evaluation              AM-PAC OT "6 Clicks" Daily Activity     Outcome Measure Help from another person eating meals?: None Help from another person taking care of personal grooming?: A Little Help from another person toileting, which includes using toliet, bedpan, or urinal?: A Little Help from another person bathing (including washing, rinsing, drying)?: A Little Help from another person to put on and taking off regular upper body  clothing?: A Little Help from another person to put on and taking off regular lower body clothing?: A Little 6 Click Score: 19   End of Session Equipment Utilized During Treatment: Gait belt;Oxygen Nurse Communication: Mobility status  Activity Tolerance: Patient tolerated treatment well;Patient limited by fatigue Patient left: in chair;with call bell/phone within reach;with chair alarm set;with family/visitor present  OT Visit Diagnosis: Muscle weakness (generalized) (M62.81)                Time: 3267-1245 OT Time Calculation (min): 34 min Charges:  OT General Charges $OT Visit: 1 Visit OT Evaluation $OT Eval Moderate Complexity: 1 Mod OT Treatments $Self Care/Home Management : 8-22 mins  Wilton Thrall H. OTR/L Supplemental OT, Department of rehab services [phone]  Zakhia Seres R H. 07/27/2020, 11:42 AM

## 2020-07-27 NOTE — Progress Notes (Signed)
PROGRESS NOTE    Lisa Owen  TDV:[id number] DOB: 02-14-1950 DOA: 07/26/2020 PCP: Blair Heys, MD    Brief Narrative:  70 y.o. female with medical history significant of mild persistent asthma, HTN, osteopenia, presented with asthma-like symptoms.  Assessment & Plan:   Active Problems:   Hypoxia   Pneumonia   PNA (pneumonia)  Acute hypoxic respite failure -Likely combination of acute asthma exacerbation plus community acquired pneumonia -Continue ceftriaxone and Zithromax -Cont supplemental O2 as per below and wean as tolerated   Suspected Acute asthma exacerbation vs undiagnosed COPD exacerbation -Cont with duo nebs, IV steroid.  Antibiotics as above. -See overnight events. Pt noted to be hypercarbic, requiring bipap support -O2 has since been weaned to [facility] this AM. Pt is home O2 naive -Cont to wean O2 as tolerated -Of note, family reports that pt has a chronic hx of coughing prior to admit with second hand tobacco smoke exposure -After discharge, recommend Pulmonary referral for outpt PFT's   Hyponatremia -HCTZ was changed to amlodipine at time of presentation -Repeat bmet in AM   Hypertension -Currently on amlodipine -BP stable    DVT prophylaxis: Lovenox subq Code Status: Full Family Communication: Pt in room, family at bedside  Status is: Inpatient  Remains inpatient appropriate because:Inpatient level of care appropriate due to severity of illness  Dispo: The patient is from: Home              Anticipated d/c is to: Home              Patient currently is not medically stable to d/c.   Difficult to place patient No       Consultants:    Procedures:    Antimicrobials: Anti-infectives (From admission, onward)    Start     Dose/Rate Route Frequency Ordered Stop   07/26/20 1030  cefTRIAXone (ROCEPHIN) 2 g in sodium chloride 0.9 % 100 mL IVPB        2 g 200 mL/hr over 30 Minutes Intravenous Every 24 hours 07/26/20 1016     07/26/20 1030   azithromycin (ZITHROMAX) 500 mg in sodium chloride 0.9 % 250 mL IVPB        500 mg 250 mL/hr over 60 Minutes Intravenous Every 24 hours 07/26/20 1016         Subjective: Still feeling sob, but better today  Objective: Vitals:   07/27/20 0730 07/27/20 0900 07/27/20 1203 07/27/20 1514  BP: (!) 151/96  (!) 145/65   Pulse: 90  (!) 104   Resp: 20  (!) 21   Temp:   99.3 F (37.4 C) 98.3 F (36.8 C)  TempSrc:   Oral Oral  SpO2: 100% 93% 97% 94%  Weight:      Height:        Intake/Output Summary (Last 24 hours) at 07/27/2020 1826 Last data filed at 07/26/2020 2100 Gross per 24 hour  Intake 100 ml  Output --  Net 100 ml   Filed Weights   07/26/20 1024  Weight: 71.2 kg    Examination: General exam: Awake, laying in bed, in nad Respiratory system: increased resp effort, decreased BS, inspiratory and expiratory wheezing Cardiovascular system: regular rate, s1, s2 Gastrointestinal system: Soft, nondistended, positive BS Central nervous system: CN2-12 grossly intact, strength intact Extremities: Perfused, no clubbing Skin: Normal skin turgor, no notable skin lesions seen Psychiatry: Mood normal // no visual hallucinations   Data Reviewed: I have personally reviewed following labs and imaging studies  CBC: Recent Labs  Lab 07/26/20 1058 07/26/20 1127 07/27/20 0102  WBC 22.8*  --  17.6*  NEUTROABS 18.9*  --   --   HGB 13.0 14.3 11.7*  HCT 38.3 42.0 34.7*  MCV 91.6  --  92.3  PLT 373  --  317   Basic Metabolic Panel: Recent Labs  Lab 07/26/20 1058 07/26/20 1127 07/27/20 0102  NA 129* 128* 130*  K 3.5 3.5 3.7  CL 91* 94* 92*  CO2 27  --  29  GLUCOSE 139* 141* 143*  BUN 23 31* 29*  CREATININE 0.82 0.80 0.98  CALCIUM 9.1  --  9.0   GFR: Estimated Creatinine Clearance: 54.4 mL/min (by C-G formula based on SCr of 0.98 mg/dL). Liver Function Tests: Recent Labs  Lab 07/26/20 1058  AST 26  ALT 29  ALKPHOS 98  BILITOT 0.8  PROT 7.2  ALBUMIN 2.9*   No  results for input(s): LIPASE, AMYLASE in the last 168 hours. No results for input(s): AMMONIA in the last 168 hours. Coagulation Profile: Recent Labs  Lab 07/26/20 1058  INR 1.1   Cardiac Enzymes: No results for input(s): CKTOTAL, CKMB, CKMBINDEX, TROPONINI in the last 168 hours. BNP (last 3 results) No results for input(s): PROBNP in the last 8760 hours. HbA1C: No results for input(s): HGBA1C in the last 72 hours. CBG: No results for input(s): GLUCAP in the last 168 hours. Lipid Profile: No results for input(s): CHOL, HDL, LDLCALC, TRIG, CHOLHDL, LDLDIRECT in the last 72 hours. Thyroid Function Tests: No results for input(s): TSH, T4TOTAL, FREET4, T3FREE, THYROIDAB in the last 72 hours. Anemia Panel: No results for input(s): VITAMINB12, FOLATE, FERRITIN, TIBC, IRON, RETICCTPCT in the last 72 hours. Sepsis Labs: Recent Labs  Lab 07/26/20 1058 07/26/20 1113  LATICACIDVEN 1.3 1.4    Recent Results (from the past 240 hour(s))  Resp Panel by RT-PCR (Flu A&B, Covid) Nasopharyngeal Swab     Status: None   Collection Time: 07/26/20 10:16 AM   Specimen: Nasopharyngeal Swab; Nasopharyngeal(NP) swabs in vial transport medium  Result Value Ref Range Status   SARS Coronavirus 2 by RT PCR NEGATIVE NEGATIVE Final    Comment: (NOTE) SARS-CoV-2 target nucleic acids are NOT DETECTED.  The SARS-CoV-2 RNA is generally detectable in upper respiratory specimens during the acute phase of infection. The lowest concentration of SARS-CoV-2 viral copies this assay can detect is 138 copies/mL. A negative result does not preclude SARS-Cov-2 infection and should not be used as the sole basis for treatment or other patient management decisions. A negative result may occur with  improper specimen collection/handling, submission of specimen other than nasopharyngeal swab, presence of viral mutation(s) within the areas targeted by this assay, and inadequate number of viral copies(<138 copies/mL). A  negative result must be combined with clinical observations, patient history, and epidemiological information. The expected result is Negative.  Fact Sheet for Patients:  BloggerCourse.com  Fact Sheet for Healthcare Providers:  SeriousBroker.it  This test is no t yet approved or cleared by the [place] FDA and  has been authorized for detection and/or diagnosis of SARS-CoV-2 by FDA under an Emergency Use Authorization (EUA). This EUA will remain  in effect (meaning this test can be used) for the duration of the COVID-19 declaration under Section 564(b)(1) of the Act, 21 U.S.C.section 360bbb-3(b)(1), unless the authorization is terminated  or revoked sooner.       Influenza A by PCR NEGATIVE NEGATIVE Final   Influenza B by PCR NEGATIVE NEGATIVE Final    Comment: (NOTE)  The Xpert Xpress SARS-CoV-2/FLU/RSV plus assay is intended as an aid in the diagnosis of influenza from Nasopharyngeal swab specimens and should not be used as a sole basis for treatment. Nasal washings and aspirates are unacceptable for Xpert Xpress SARS-CoV-2/FLU/RSV testing.  Fact Sheet for Patients: BloggerCourse.com  Fact Sheet for Healthcare Providers: SeriousBroker.it  This test is not yet approved or cleared by the [place] FDA and has been authorized for detection and/or diagnosis of SARS-CoV-2 by FDA under an Emergency Use Authorization (EUA). This EUA will remain in effect (meaning this test can be used) for the duration of the COVID-19 declaration under Section 564(b)(1) of the Act, 21 U.S.C. section 360bbb-3(b)(1), unless the authorization is terminated or revoked.  Performed at [facility], [address] [place], [place] [address]   Blood Culture (routine x 2)     Status: None (Preliminary result)   Collection Time: 07/26/20 11:13 AM   Specimen: BLOOD  Result Value Ref Range  Status   Specimen Description BLOOD LEFT ANTECUBITAL  Final   Special Requests   Final    BOTTLES DRAWN AEROBIC AND ANAEROBIC Blood Culture results may not be optimal due to an excessive volume of blood received in culture bottles   Culture   Final    NO GROWTH < 24 HOURS Performed at [facility], [address] [place], [place] [address]    Report Status PENDING  Incomplete  Blood Culture (routine x 2)     Status: None (Preliminary result)   Collection Time: 07/26/20 11:13 AM   Specimen: BLOOD LEFT HAND  Result Value Ref Range Status   Specimen Description BLOOD LEFT HAND  Final   Special Requests   Final    BOTTLES DRAWN AEROBIC AND ANAEROBIC Blood Culture adequate volume   Culture   Final    NO GROWTH < 24 HOURS Performed at [facility], [address] [place], [place] [address]    Report Status PENDING  Incomplete     Radiology Studies: DG CHEST PORT 1 VIEW  Result Date: 07/26/2020 CLINICAL DATA:  Shortness of breath EXAM: PORTABLE CHEST 1 VIEW COMPARISON:  None. FINDINGS: Hazy right lower lobe airspace disease. Bilateral mild interstitial thickening. No pleural effusion or pneumothorax. Heart and mediastinal contours are unremarkable. No acute osseous abnormality. IMPRESSION: Right lower lobe airspace disease most concerning for pneumonia. Bilateral interstitial thickening can be seen in the setting of atypical viral pneumonia. Electronically Signed   By: Elige Ko   On: 07/26/2020 10:42    Scheduled Meds:  amLODipine  5 mg Oral Daily   aspirin EC  81 mg Oral Daily   cholecalciferol  1,000 Units Oral Daily   enoxaparin (LOVENOX) injection  40 mg Subcutaneous Daily   fluticasone furoate-vilanterol  1 puff Inhalation Daily   folic acid  0.5 mg Oral Daily   guaiFENesin  1,200 mg Oral BID   ipratropium-albuterol  3 mL Nebulization BID   lactose free nutrition  237 mL Oral Q24H   multivitamin with minerals  1 tablet Oral Daily   [START ON 07/28/2020]  predniSONE  40 mg Oral Q breakfast   Continuous Infusions:  azithromycin 500 mg (07/27/20 1145)   cefTRIAXone (ROCEPHIN)  IV 2 g (07/27/20 0947)     LOS: 1 day   Rickey Barbara, MD [facility] Pager On Amion  If 7PM-7AM, please contact night-coverage 07/27/2020, 6:26 PM

## 2020-07-27 NOTE — Progress Notes (Signed)
Initial Nutrition Assessment  DOCUMENTATION CODES:   Not applicable  INTERVENTION:   - Boost Plus po daily, each supplement provides 360 kcal and 14 grams of protein  - MVI with minerals daily  NUTRITION DIAGNOSIS:   Increased nutrient needs related to acute illness as evidenced by estimated needs.  GOAL:   Patient will meet greater than or equal to 90% of their needs  MONITOR:   PO intake, Supplement acceptance, Labs, Weight trends  REASON FOR ASSESSMENT:   Consult COPD Protocol  ASSESSMENT:   70 year old female who presented to the ED on 6/20 with SOB. PMH of asthma, HTN, osteopenia.  Pt admitted with acute asthma exacerbation.  Spoke with pt at bedside. Pt reports appetite is picking back up. Pt states that she normally has a great appetite and eats well. About one week ago, pt's started having a poor appetite. This aligns with when pt started to feel sick. Pt states that over the last week, she has been eating things like eggs and peanut butter and honey sandwiches. Pt has been drinking "a lot of water" and cranberry juice. Pt states that her portions over the last week have been about half of what she normally consumes.  Pt denies any recent changes in weight. She reports a UBW of 155-160 lbs. Pt states that her weight on Friday of last week was 157 lbs at the doctor's office. Weight on admission of 157 lbs appears stated rather than measured. No other weight history available in chart.  Pt with a breakfast meal tray at bedside. Pt had consumed a few bites of oatmeal, all of her fruit, and almost all of her cranberry juice. RD assisted pt in ordering lunch meal.  Pt willing to consume an oral nutrition supplement to aid in meeting kcal and protein needs. She prefers Boost in the chocolate flavor. RD has ordered Boost Plus daily as well as daily MVI which pt reports that she takes at home. Pt also reports taking cholecalciferol (ordered), vitamin C for "antioxidants," and  vitamin B-12 for "boosting metabolism."  Discussed importance of adequate kcal and protein intake in healing and in maintaining lean muscle mass. Pt expresses understanding.  Pt denies any N/V at this time but states that she did have some PTA. Pt also denies any difficulties chewing or swallowing. Pt states that she ambulates without assistance at home.  Medications reviewed and include: cholecalciferol, folic acid, IV solu-medrol, IV abx  Labs reviewed: sodium 130, BUN 29  NUTRITION - FOCUSED PHYSICAL EXAM:  Flowsheet Row Most Recent Value  Orbital Region No depletion  Upper Arm Region No depletion  Thoracic and Lumbar Region No depletion  Buccal Region Mild depletion  Temple Region No depletion  Clavicle Bone Region Mild depletion  Clavicle and Acromion Bone Region Mild depletion  Scapular Bone Region No depletion  Dorsal Hand No depletion  Patellar Region No depletion  Anterior Thigh Region No depletion  Posterior Calf Region No depletion  Edema (RD Assessment) None  Hair Reviewed  Eyes Reviewed  Mouth Reviewed  Skin Reviewed  Nails Reviewed       Diet Order:   Diet Order             Diet 2 gram sodium Room service appropriate? Yes; Fluid consistency: Thin  Diet effective now                   EDUCATION NEEDS:   Education needs have been addressed  Skin:  Skin Assessment: Reviewed RN  Assessment  Last BM:  07/25/20  Height:   Ht Readings from Last 1 Encounters:  07/26/20 5\' 5"  (1.651 m)    Weight:   Wt Readings from Last 1 Encounters:  07/26/20 71.2 kg    BMI:  Body mass index is 26.13 kg/m.  Estimated Nutritional Needs:   Kcal:  1700-1900  Protein:  85-95 grams  Fluid:  1.7-1.9 L    07/28/20, MS, RD, LDN Inpatient Clinical Dietitian Please see AMiON for contact information.
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
## 2020-07-27 NOTE — Evaluation (Signed)
Physical Therapy Evaluation Patient Details Name: Lisa Owen MRN: [id number] DOB: 12/25/1950 Today's Date: 07/27/2020   History of Present Illness  Pt is 70 y.o. female who presented to ED on 6/20 with worsening SOB, productive cough and wheezing. Patient admitted with PNA, acute hypoxic respiratory failure and acute asthma exacerbation. PMHx significant for mild persistent asthma, HTN and osteopenia.  Clinical Impression  Pt admitted with above diagnosis. Pt is from home with spouse and is normally independent with ADLs, IADLS, driving, and community ambulation.  Today, pt limited due to respiratory status.  She was able to ambulate 61' without AD but with mild unsteadiness and O2 sats decreasing to 79% at lowest on 6 L O2 (8 L to recover).  Pt has family support and DME at home.  Pt with good rehab potential.  Pt currently with functional limitations due to the deficits listed below (see PT Problem List). Pt will benefit from skilled PT to increase their independence and safety with mobility to allow discharge to the venue listed below.       Follow Up Recommendations Home health PT;Supervision - Intermittent    Equipment Recommendations  None recommended by PT    Recommendations for Other Services       Precautions / Restrictions Precautions Precautions: Other (comment) Precaution Comments: Monitor vitals      Mobility  Bed Mobility               General bed mobility comments: in chair at arrival; was mod I with OT earlier today    Transfers   Equipment used: None Transfers: Sit to/from Stand Sit to Stand: Supervision         General transfer comment: supervision for safety  Ambulation/Gait Ambulation/Gait assistance: Min guard Gait Distance (Feet): 60 Feet Assistive device: None (occasional use of rail) Gait Pattern/deviations: Step-to pattern;Decreased stride length Gait velocity: decreased   General Gait Details: Pt ambulate 60' at decreased speed.  Had  no LOB but did reach for sink/handrail at times. Cues for pursed lip breathing  Stairs            Wheelchair Mobility    Modified Rankin (Stroke Patients Only)       Balance Overall balance assessment: Needs assistance Sitting-balance support: No upper extremity supported Sitting balance-Leahy Scale: Good     Standing balance support: No upper extremity supported Standing balance-Leahy Scale: Good                               Pertinent Vitals/Pain Pain Assessment: No/denies pain    Home Living Family/patient expects to be discharged to:: Private residence Living Arrangements: Spouse/significant other Available Help at Discharge: Family;Available 24 hours/day Type of Home: House Home Access: Stairs to enter Entrance Stairs-Rails: None Entrance Stairs-Number of Steps: 2 Home Layout: One level Home Equipment: Walker - 2 wheels;Crutches;Cane - single point;Bedside commode;Transport chair Additional Comments: equipment from her mother    Prior Function Level of Independence: Independent         Comments: I with ADLs/IADL; drives; could ambulate in community     Hand Dominance   Dominant Hand: Right    Extremity/Trunk Assessment   Upper Extremity Assessment Upper Extremity Assessment: Overall WFL for tasks assessed    Lower Extremity Assessment Lower Extremity Assessment: Overall WFL for tasks assessed    Cervical / Trunk Assessment Cervical / Trunk Assessment: Normal  Communication   Communication: No difficulties  Cognition Arousal/Alertness: Awake/alert  Behavior During Therapy: WFL for tasks assessed/performed Overall Cognitive Status: Within Functional Limits for tasks assessed                                        General Comments General comments (skin integrity, edema, etc.): Pt on 6 L O2 with sats 92% rest, dropped to 87% ambulation but dropped further to 79% with recovery.  Not improving with 1 min rest so  increased to 8 L O2.  On 8 L sats improved to 92% in 1 min and able to wean back to 6 L with sats 91%.  Educated on PT role/POC, gradual increase in endurance, and recommendation for HHPT.  Also, encouraged OOB as much as possible with staff (at least up for meals and walking to bathroom - pt agrees and eager to move)    Exercises     Assessment/Plan    PT Assessment Patient needs continued PT services  PT Problem List Decreased strength;Decreased mobility;Decreased range of motion;Decreased activity tolerance;Decreased knowledge of precautions;Cardiopulmonary status limiting activity;Decreased balance;Decreased knowledge of use of DME       PT Treatment Interventions DME instruction;Therapeutic activities;Gait training;Therapeutic exercise;Patient/family education;Stair training;Balance training;Functional mobility training    PT Goals (Current goals can be found in the Care Plan section)  Acute Rehab PT Goals Patient Stated Goal: To feel better. PT Goal Formulation: With patient Time For Goal Achievement: 08/10/20 Potential to Achieve Goals: Good    Frequency Min 3X/week   Barriers to discharge        Co-evaluation               AM-PAC PT "6 Clicks" Mobility  Outcome Measure Help needed turning from your back to your side while in a flat bed without using bedrails?: A Little Help needed moving from lying on your back to sitting on the side of a flat bed without using bedrails?: A Little Help needed moving to and from a bed to a chair (including a wheelchair)?: A Little Help needed standing up from a chair using your arms (e.g., wheelchair or bedside chair)?: A Little Help needed to walk in hospital room?: A Little Help needed climbing 3-5 steps with a railing? : A Little 6 Click Score: 18    End of Session Equipment Utilized During Treatment: Oxygen Activity Tolerance: Patient tolerated treatment well Patient left: with chair alarm set;in chair;with family/visitor  present;with call bell/phone within reach Nurse Communication: Mobility status PT Visit Diagnosis: Other abnormalities of gait and mobility (R26.89)    Time: 6962-9528 PT Time Calculation (min) (ACUTE ONLY): 30 min   Charges:   PT Evaluation $PT Eval Moderate Complexity: 1 Mod PT Treatments $Gait Training: 8-22 mins        Anise Salvo, PT Acute Rehab Services Pager [phone] [facility] [phone]   Rayetta Humphrey 07/27/2020, 3:57 PM

## 2020-07-28 DIAGNOSIS — J14 Pneumonia due to Hemophilus influenzae: Secondary | ICD-10-CM

## 2020-07-28 DIAGNOSIS — R7881 Bacteremia: Secondary | ICD-10-CM

## 2020-07-28 DIAGNOSIS — I10 Essential (primary) hypertension: Secondary | ICD-10-CM

## 2020-07-28 LAB — BLOOD CULTURE ID PANEL (REFLEXED) - BCID2

## 2020-07-28 LAB — CBC
HCT: 32.6 % — ABNORMAL LOW (ref 36.0–46.0)
Hemoglobin: 11.2 g/dL — ABNORMAL LOW (ref 12.0–15.0)
MCH: 31.5 pg (ref 26.0–34.0)
MCHC: 34.4 g/dL (ref 30.0–36.0)
MCV: 91.8 fL (ref 80.0–100.0)
Platelets: 347 10*3/uL (ref 150–400)
RBC: 3.55 MIL/uL — ABNORMAL LOW (ref 3.87–5.11)
RDW: 14.4 % (ref 11.5–15.5)
WBC: 19.4 10*3/uL — ABNORMAL HIGH (ref 4.0–10.5)
nRBC: 0 % (ref 0.0–0.2)

## 2020-07-28 LAB — COMPREHENSIVE METABOLIC PANEL
ALT: 29 U/L (ref 0–44)
AST: 20 U/L (ref 15–41)
Albumin: 2.5 g/dL — ABNORMAL LOW (ref 3.5–5.0)
Alkaline Phosphatase: 81 U/L (ref 38–126)
Anion gap: 8 (ref 5–15)
BUN: 30 mg/dL — ABNORMAL HIGH (ref 8–23)
CO2: 30 mmol/L (ref 22–32)
Calcium: 9.1 mg/dL (ref 8.9–10.3)
Chloride: 93 mmol/L — ABNORMAL LOW (ref 98–111)
Creatinine, Ser: 0.78 mg/dL (ref 0.44–1.00)
GFR, Estimated: >60 mL/min (ref >60)
Glucose, Bld: 137 mg/dL — ABNORMAL HIGH (ref 70–99)
Potassium: 3.8 mmol/L (ref 3.5–5.1)
Sodium: 131 mmol/L — ABNORMAL LOW (ref 135–145)
Total Bilirubin: 0.6 mg/dL (ref 0.3–1.2)
Total Protein: 6.3 g/dL — ABNORMAL LOW (ref 6.5–8.1)

## 2020-07-28 MED ORDER — HYDRALAZINE HCL 10 MG PO TABS
10.0000 mg | ORAL_TABLET | Freq: Four times a day (QID) | ORAL | Status: DC | PRN
  Administered 2020-07-28: 10 mg via ORAL
  Filled 2020-07-28: qty 1

## 2020-07-28 NOTE — Progress Notes (Signed)
[facility] night shift.  The nursing staff reports that the pt's BP is 183/82 mmHg of mercury. She is currently on amlodipine 5 mg PO QD. Hydralazine 10 mg po every 6 hrs PRN ordered.   Sanda Klein, MD

## 2020-07-28 NOTE — Consult Note (Signed)
[facility]       Reason for Consult: gram negative bacteremia    Referring Physician: CHAMP autoconsult/Dr. Fran Lowes  Active Problems:   Hypoxia   Pneumonia   PNA (pneumonia)    amLODipine  5 mg Oral Daily   aspirin EC  81 mg Oral Daily   cholecalciferol  1,000 Units Oral Daily   enoxaparin (LOVENOX) injection  40 mg Subcutaneous Daily   fluticasone furoate-vilanterol  1 puff Inhalation Daily   folic acid  0.5 mg Oral Daily   guaiFENesin  1,200 mg Oral BID   ipratropium-albuterol  3 mL Nebulization BID   lactose free nutrition  237 mL Oral Q24H   multivitamin with minerals  1 tablet Oral Daily   predniSONE  40 mg Oral Q breakfast    Recommendations: Continue ceftriaxone Will stop azithromycin   Assessment: She has hypoxia,  leukocytosis, H influenza bacteremia with a right lower lobe airspace disease most c/w acute bacterial pneumonia with underlying structural lung disease/COPD.  I recommend 5 days total of antibiotics with continued clinical improvement, now day 3/5.   Hypoxia - ABG noted and I suspect she has underlying oxygen needs ongoing exacerbated by the pneumonia.  Agree she will need outpatient evaluation by pulmonary.   Antibiotics: Ceftriaxone and azithromycin day   HPI: Lisa Owen is a 70 y.o. female with a history of tobacco abuse with recent intermittent use, second hand smoke and no known chronic lung disease who came in the shortness of breath and findings as noted above.  No particular fever or chills but felt to be wheezing.  CXR with pneumonia and blood cultures positive for H influenza.  Feels better since admission.  Husband at bedside.     Review of Systems:  Constitutional: positive for fatigue or negative for fevers and chills Respiratory: positive for cough or sputum, negative for hemoptysis Gastrointestinal: negative for nausea and diarrhea Integument/breast: negative for rash All other systems reviewed and are  negative    Past Medical History:  Diagnosis Date   Allergy    Arrhythmia    tacycardia    Arthritis    (Thumb CMCs bil)   Carotid bruit    doppler normal 09/2009   Hx of hysterectomy    secondary endometriosis/ left oorphorectomy secondary endometriosis   Hypertension    Osteopenia    Thyroid disease    hypothyroidism    Social History   Tobacco Use   Smoking status: Some Days    Pack years: 0.00  Substance Use Topics   Alcohol use: No    Family History  Problem Relation Age of Onset   Arthritis Mother    Lung cancer Father    CVA Sister    Breast cancer Maternal Aunt     Allergies  Allergen Reactions   Boniva [Ibandronic Acid]     Aches and GI upset   Clarinex [Desloratadine]    Fosamax [Alendronate Sodium]    Sudafed [Pseudoephedrine]     Hypertension Tachycardia     Physical Exam: Constitutional: in no apparent distress  Vitals:   07/28/20 0829 07/28/20 0830  BP:    Pulse:    Resp:    Temp:    SpO2: 98% 97%   EYES: anicteric ENMT: no thrush Cardiovascular: Cor RRR Respiratory: clear; no wheezing noted GI: Bowel sounds are normal, liver is not enlarged, spleen is not enlarged Musculoskeletal: no pedal edema noted Skin: negatives: no rash Neuro: non-focal  Lab Results  Component Value Date   WBC 19.4 (H) 07/28/2020   HGB 11.2 (L) 07/28/2020   HCT 32.6 (L) 07/28/2020   MCV 91.8 07/28/2020   PLT 347 07/28/2020    Lab Results  Component Value Date   CREATININE 0.78 07/28/2020   BUN 30 (H) 07/28/2020   NA 131 (L) 07/28/2020   K 3.8 07/28/2020   CL 93 (L) 07/28/2020   CO2 30 07/28/2020    Lab Results  Component Value Date   ALT 29 07/28/2020   AST 20 07/28/2020   ALKPHOS 81 07/28/2020     Microbiology: Recent Results (from the past 240 hour(s))  Resp Panel by RT-PCR (Flu A&B, Covid) Nasopharyngeal Swab     Status: None   Collection Time: 07/26/20 10:16 AM   Specimen: Nasopharyngeal Swab; Nasopharyngeal(NP) swabs in vial  transport medium  Result Value Ref Range Status   SARS Coronavirus 2 by RT PCR NEGATIVE NEGATIVE Final    Comment: (NOTE) SARS-CoV-2 target nucleic acids are NOT DETECTED.  The SARS-CoV-2 RNA is generally detectable in upper respiratory specimens during the acute phase of infection. The lowest concentration of SARS-CoV-2 viral copies this assay can detect is 138 copies/mL. A negative result does not preclude SARS-Cov-2 infection and should not be used as the sole basis for treatment or other patient management decisions. A negative result may occur with  improper specimen collection/handling, submission of specimen other than nasopharyngeal swab, presence of viral mutation(s) within the areas targeted by this assay, and inadequate number of viral copies(<138 copies/mL). A negative result must be combined with clinical observations, patient history, and epidemiological information. The expected result is Negative.  Fact Sheet for Patients:  BloggerCourse.com  Fact Sheet for Healthcare Providers:  SeriousBroker.it  This test is no t yet approved or cleared by the [place] FDA and  has been authorized for detection and/or diagnosis of SARS-CoV-2 by FDA under an Emergency Use Authorization (EUA). This EUA will remain  in effect (meaning this test can be used) for the duration of the COVID-19 declaration under Section 564(b)(1) of the Act, 21 U.S.C.section 360bbb-3(b)(1), unless the authorization is terminated  or revoked sooner.       Influenza A by PCR NEGATIVE NEGATIVE Final   Influenza B by PCR NEGATIVE NEGATIVE Final    Comment: (NOTE) The Xpert Xpress SARS-CoV-2/FLU/RSV plus assay is intended as an aid in the diagnosis of influenza from Nasopharyngeal swab specimens and should not be used as a sole basis for treatment. Nasal washings and aspirates are unacceptable for Xpert Xpress SARS-CoV-2/FLU/RSV testing.  Fact  Sheet for Patients: BloggerCourse.com  Fact Sheet for Healthcare Providers: SeriousBroker.it  This test is not yet approved or cleared by the [place] FDA and has been authorized for detection and/or diagnosis of SARS-CoV-2 by FDA under an Emergency Use Authorization (EUA). This EUA will remain in effect (meaning this test can be used) for the duration of the COVID-19 declaration under Section 564(b)(1) of the Act, 21 U.S.C. section 360bbb-3(b)(1), unless the authorization is terminated or revoked.  Performed at [facility], [address], [place], [place] [address]   Blood Culture (routine x 2)     Status: None (Preliminary result)   Collection Time: 07/26/20 11:13 AM   Specimen: BLOOD  Result Value Ref Range Status   Specimen Description BLOOD LEFT ANTECUBITAL  Final   Special Requests   Final    BOTTLES DRAWN AEROBIC AND ANAEROBIC Blood Culture results may not be optimal due to an  excessive volume of blood received in culture bottles   Culture  Setup Time   Final    GRAM NEGATIVE RODS AEROBIC BOTTLE ONLY CRITICAL VALUE NOTED.  VALUE IS CONSISTENT WITH PREVIOUSLY REPORTED AND CALLED VALUE. Performed at [facility], [address] [place], [place] [address]    Culture GRAM NEGATIVE RODS  Final   Report Status PENDING  Incomplete  Blood Culture (routine x 2)     Status: Abnormal (Preliminary result)   Collection Time: 07/26/20 11:13 AM   Specimen: BLOOD LEFT HAND  Result Value Ref Range Status   Specimen Description BLOOD LEFT HAND  Final   Special Requests   Final    BOTTLES DRAWN AEROBIC AND ANAEROBIC Blood Culture adequate volume   Culture  Setup Time   Final    GRAM NEGATIVE RODS AEROBIC BOTTLE ONLY CRITICAL RESULT CALLED TO, READ BACK BY AND VERIFIED WITHMelven Sartorius PHARMD, AT 0224 07/28/20 DV    Culture (A)  Final    HAEMOPHILUS INFLUENZAE HEALTH DEPARTMENT NOTIFIED Performed at [facility], [address] [place], [place] [address]    Report Status PENDING  Incomplete  Blood Culture ID Panel (Reflexed)     Status: Abnormal   Collection Time: 07/26/20 11:13 AM  Result Value Ref Range Status   Enterococcus faecalis NOT DETECTED NOT DETECTED Final   Enterococcus Faecium NOT DETECTED NOT DETECTED Final   Listeria monocytogenes NOT DETECTED NOT DETECTED Final   Staphylococcus species NOT DETECTED NOT DETECTED Final   Staphylococcus aureus (BCID) NOT DETECTED NOT DETECTED Final   Staphylococcus epidermidis NOT DETECTED NOT DETECTED Final   Staphylococcus lugdunensis NOT DETECTED NOT DETECTED Final   Streptococcus species NOT DETECTED NOT DETECTED Final   Streptococcus agalactiae NOT DETECTED NOT DETECTED Final   Streptococcus pneumoniae NOT DETECTED NOT DETECTED Final   Streptococcus pyogenes NOT DETECTED NOT DETECTED Final   A.calcoaceticus-baumannii NOT DETECTED NOT DETECTED Final   Bacteroides fragilis NOT DETECTED NOT DETECTED Final   Enterobacterales NOT DETECTED NOT DETECTED Final   Enterobacter cloacae complex NOT DETECTED NOT DETECTED Final   Escherichia coli NOT DETECTED NOT DETECTED Final   Klebsiella aerogenes NOT DETECTED NOT DETECTED Final   Klebsiella oxytoca NOT DETECTED NOT DETECTED Final   Klebsiella pneumoniae NOT DETECTED NOT DETECTED Final   Proteus species NOT DETECTED NOT DETECTED Final   Salmonella species NOT DETECTED NOT DETECTED Final   Serratia marcescens NOT DETECTED NOT DETECTED Final   Haemophilus influenzae DETECTED (A) NOT DETECTED Final    Comment: CRITICAL RESULT CALLED TO, READ BACK BY AND VERIFIED WITH: J. LEDFORD PHARMD, AT 0224 07/28/20 DV    Neisseria meningitidis NOT DETECTED NOT DETECTED Final   Pseudomonas aeruginosa NOT DETECTED NOT DETECTED Final   Stenotrophomonas maltophilia NOT DETECTED NOT DETECTED Final   Candida albicans NOT DETECTED NOT DETECTED Final   Candida auris NOT DETECTED NOT DETECTED Final   Candida  glabrata NOT DETECTED NOT DETECTED Final   Candida krusei NOT DETECTED NOT DETECTED Final   Candida parapsilosis NOT DETECTED NOT DETECTED Final   Candida tropicalis NOT DETECTED NOT DETECTED Final   Cryptococcus neoformans/gattii NOT DETECTED NOT DETECTED Final    Comment: Performed at [facility], [address] [place], [place] [address]    Gardiner Barefoot, MD [facility] [email or web address] 07/28/2020, 10:28 AM

## 2020-07-28 NOTE — TOC Initial Note (Addendum)
Transition of Care [facility]) - Initial/Assessment Note    Patient Details  Name: CARLON DAVIDSON MRN: [id number] Date of Birth: Sep 24, 1950  [facility] CM/SW Contact:    Joanne Chars, LCSW Phone Number: 07/28/2020, 11:21 AM  Clinical Narrative:   CSW met with pt and husband Louie Casa to discuss discharge recommendation for [facility].  Permission given to speak with husband present.  Pt agreeable to [facility], choice document given, no agency preference indicated.  Current equipment in home: walker, cane, 3n1.  PCP in place.  Pt is not vaccinated for covid.  CSW also spoke with pt regarding medications.  Pt goes to [facility] and reports she does not have current issues with paying for medications unless a very expensive one is prescribed. CSW noted med assistance consult and will continue to follow.                  Expected Discharge Plan: [place] Barriers to Discharge: Continued Medical Work up   Patient Goals and CMS Choice Patient states their goals for this hospitalization and ongoing recovery are:: "walking and breathing" CMS Medicare.gov Compare Post Acute Care list provided to:: Patient Choice offered to / list presented to : Patient  Expected Discharge Plan and Services Expected Discharge Plan: [place] In-house Referral: Clinical Social Work   Post Acute Care Choice: [place] arrangements for the past 2 months: [place]: PT [place]: [place] Date [place]: 07/28/20 Time HH Agency Contacted: 1110 Representative spoke with at [place]: Tommi Rumps  Prior Living Arrangements/Services Living arrangements for the past 2 months: [place] with:: Spouse Patient language and need for interpreter reviewed:: Yes Do you feel safe going back to the place where you live?: Yes      Need for Family Participation in Patient Care: No (Comment) Care  giver support system in place?: Yes (comment) Current home services: Other (comment) (none) Criminal Activity/Legal Involvement Pertinent to Current Situation/Hospitalization: No - Comment as needed  Activities of Daily Living      Permission Sought/Granted Permission sought to share information with : Family Supports Permission granted to share information with : Yes, Verbal Permission Granted  Share Information with NAME: husband Louie Casa  Permission granted to share info w AGENCY: HH        Emotional Assessment Appearance:: Appears stated age Attitude/Demeanor/Rapport: Engaged Affect (typically observed): Appropriate, Pleasant Orientation: : Oriented to Self, Oriented to Place, Oriented to  Time, Oriented to Situation Alcohol / Substance Use: Not Applicable Psych Involvement: No (comment)  Admission diagnosis:  SOB (shortness of breath) [R06.02] PNA (pneumonia) [J18.9] Community acquired pneumonia of right lower lobe of lung [J18.9] Sepsis with acute hypoxic respiratory failure without septic shock, due to unspecified organism ([place]) [A41.9, R65.20, J96.01] Patient Active Problem List   Diagnosis Date Noted   Hypoxia 07/26/2020   Pneumonia 07/26/2020   PNA (pneumonia) 07/26/2020   Sepsis with acute hypoxic respiratory failure without septic shock ([place])    SOB (shortness of breath)    PCP:  Gaynelle Arabian, MD Pharmacy:   [facility] [place], [address] [place] [address] Phone: [phone] Fax: [phone]  Social Determinants of Health (SDOH) Interventions    Readmission Risk Interventions No flowsheet data found.   

## 2020-07-28 NOTE — Progress Notes (Signed)
PHARMACY - PHYSICIAN COMMUNICATION CRITICAL VALUE ALERT - BLOOD CULTURE IDENTIFICATION (BCID)  Lisa Owen is an 70 y.o. female who presented to [facility] on 07/26/2020 with a chief complaint of pneumonia  Assessment:  WBC elevated, Tmax 99.3  Name of physician (or Provider) Contacted: Dr. Antionette Char  Current antibiotics:  Ceftriaxone 2g IV q24h  Azithromycin 500 mg IV q24h  Changes to prescribed antibiotics recommended:  No changes  Results for orders placed or performed during the hospital encounter of 07/26/20  Blood Culture ID Panel (Reflexed) (Collected: 07/26/2020 11:13 AM)  Result Value Ref Range   Enterococcus faecalis NOT DETECTED NOT DETECTED   Enterococcus Faecium NOT DETECTED NOT DETECTED   Listeria monocytogenes NOT DETECTED NOT DETECTED   Staphylococcus species NOT DETECTED NOT DETECTED   Staphylococcus aureus (BCID) NOT DETECTED NOT DETECTED   Staphylococcus epidermidis NOT DETECTED NOT DETECTED   Staphylococcus lugdunensis NOT DETECTED NOT DETECTED   Streptococcus species NOT DETECTED NOT DETECTED   Streptococcus agalactiae NOT DETECTED NOT DETECTED   Streptococcus pneumoniae NOT DETECTED NOT DETECTED   Streptococcus pyogenes NOT DETECTED NOT DETECTED   A.calcoaceticus-baumannii NOT DETECTED NOT DETECTED   Bacteroides fragilis NOT DETECTED NOT DETECTED   Enterobacterales NOT DETECTED NOT DETECTED   Enterobacter cloacae complex NOT DETECTED NOT DETECTED   Escherichia coli NOT DETECTED NOT DETECTED   Klebsiella aerogenes NOT DETECTED NOT DETECTED   Klebsiella oxytoca NOT DETECTED NOT DETECTED   Klebsiella pneumoniae NOT DETECTED NOT DETECTED   Proteus species NOT DETECTED NOT DETECTED   Salmonella species NOT DETECTED NOT DETECTED   Serratia marcescens NOT DETECTED NOT DETECTED   Haemophilus influenzae DETECTED (A) NOT DETECTED   Neisseria meningitidis NOT DETECTED NOT DETECTED   Pseudomonas aeruginosa NOT DETECTED NOT DETECTED   Stenotrophomonas maltophilia NOT  DETECTED NOT DETECTED   Candida albicans NOT DETECTED NOT DETECTED   Candida auris NOT DETECTED NOT DETECTED   Candida glabrata NOT DETECTED NOT DETECTED   Candida krusei NOT DETECTED NOT DETECTED   Candida parapsilosis NOT DETECTED NOT DETECTED   Candida tropicalis NOT DETECTED NOT DETECTED   Cryptococcus neoformans/gattii NOT DETECTED NOT DETECTED    Abran Duke 07/28/2020  2:44 AM

## 2020-07-28 NOTE — Progress Notes (Signed)
PROGRESS NOTE    Lisa Owen  MLY:[id number] DOB: 1950-05-25 DOA: 07/26/2020 PCP: Blair Heys, MD    Brief Narrative:  70 y.o. female with medical history significant of mild persistent asthma, HTN, osteopenia, presented with asthma-like symptoms.  Assessment & Plan:   Active Problems:   Hypoxia   Pneumonia   PNA (pneumonia)  Acute hypoxic respiratory failure 2/2 Right LL PNA --started on ceftriaxone and Zithromax on admission --cont ceftriaxone --d/c azithromycin, per ID --Continue supplemental O2 to keep sats >=92%, wean as tolerated  H influenza bacteremia --ID auto-consult today --cont ceftriaxone   Suspected Acute asthma exacerbation vs undiagnosed COPD exacerbation 2nd hand smoke --cont bronchodilators, steroid and abx --outpatient pulm followup  Acute hypercapnic respiratory failure -Pt noted to be hypercarbic, requiring bipap support --BiPAP nightly and PRN   Hyponatremia -HCTZ was changed to amlodipine at time of presentation   Hypertension -HCTZ was changed to amlodipine at time of presentation --cont amlodipine    DVT prophylaxis: Lovenox subq Code Status: Full Family Communication:  Status is: Inpatient  Remains inpatient appropriate because:Inpatient level of care appropriate due to severity of illness  Dispo: The patient is from: Home              Anticipated d/c is to: Home              Patient currently is not medically stable to d/c.  On IV abx for bacteremia.  4L O2 new.    Difficult to place patient No    Consultants:    Procedures:    Antimicrobials: Anti-infectives (From admission, onward)    Start     Dose/Rate Route Frequency Ordered Stop   07/26/20 1030  cefTRIAXone (ROCEPHIN) 2 g in sodium chloride 0.9 % 100 mL IVPB        2 g 200 mL/hr over 30 Minutes Intravenous Every 24 hours 07/26/20 1016     07/26/20 1030  azithromycin (ZITHROMAX) 500 mg in sodium chloride 0.9 % 250 mL IVPB  Status:  Discontinued        500  mg 250 mL/hr over 60 Minutes Intravenous Every 24 hours 07/26/20 1016 07/28/20 0936       Subjective: Dyspnea improved.  Complained of post-nasal drip.  Still coughing up phlegm.     Objective: Vitals:   07/28/20 0507 07/28/20 0829 07/28/20 0830 07/28/20 1510  BP: (!) 161/70   (!) 154/73  Pulse: 70   84  Resp: 20   20  Temp: 98.1 F (36.7 C)   99 F (37.2 C)  TempSrc: Axillary   Oral  SpO2: 96% 98% 97% 99%  Weight:      Height:        Intake/Output Summary (Last 24 hours) at 07/28/2020 1721 Last data filed at 07/28/2020 0945 Gross per 24 hour  Intake 480 ml  Output --  Net 480 ml   Filed Weights   07/26/20 1024  Weight: 71.2 kg    Examination: Constitutional: NAD, AAOx3 HEENT: conjunctivae and lids normal, EOMI CV: No cyanosis.   RESP: normal respiratory effort, on 5L Extremities: No effusions, edema in BLE SKIN: warm, dry Neuro: II - XII grossly intact.   Psych: Normal mood and affect.  Appropriate judgement and reason   Data Reviewed: I have personally reviewed following labs and imaging studies  CBC: Recent Labs  Lab 07/26/20 1058 07/26/20 1127 07/27/20 0102 07/28/20 0114  WBC 22.8*  --  17.6* 19.4*  NEUTROABS 18.9*  --   --   --  HGB 13.0 14.3 11.7* 11.2*  HCT 38.3 42.0 34.7* 32.6*  MCV 91.6  --  92.3 91.8  PLT 373  --  317 347   Basic Metabolic Panel: Recent Labs  Lab 07/26/20 1058 07/26/20 1127 07/27/20 0102 07/28/20 0114  NA 129* 128* 130* 131*  K 3.5 3.5 3.7 3.8  CL 91* 94* 92* 93*  CO2 27  --  29 30  GLUCOSE 139* 141* 143* 137*  BUN 23 31* 29* 30*  CREATININE 0.82 0.80 0.98 0.78  CALCIUM 9.1  --  9.0 9.1   GFR: Estimated Creatinine Clearance: 66.6 mL/min (by C-G formula based on SCr of 0.78 mg/dL). Liver Function Tests: Recent Labs  Lab 07/26/20 1058 07/28/20 0114  AST 26 20  ALT 29 29  ALKPHOS 98 81  BILITOT 0.8 0.6  PROT 7.2 6.3*  ALBUMIN 2.9* 2.5*   No results for input(s): LIPASE, AMYLASE in the last 168  hours. No results for input(s): AMMONIA in the last 168 hours. Coagulation Profile: Recent Labs  Lab 07/26/20 1058  INR 1.1   Cardiac Enzymes: No results for input(s): CKTOTAL, CKMB, CKMBINDEX, TROPONINI in the last 168 hours. BNP (last 3 results) No results for input(s): PROBNP in the last 8760 hours. HbA1C: No results for input(s): HGBA1C in the last 72 hours. CBG: No results for input(s): GLUCAP in the last 168 hours. Lipid Profile: No results for input(s): CHOL, HDL, LDLCALC, TRIG, CHOLHDL, LDLDIRECT in the last 72 hours. Thyroid Function Tests: No results for input(s): TSH, T4TOTAL, FREET4, T3FREE, THYROIDAB in the last 72 hours. Anemia Panel: No results for input(s): VITAMINB12, FOLATE, FERRITIN, TIBC, IRON, RETICCTPCT in the last 72 hours. Sepsis Labs: Recent Labs  Lab 07/26/20 1058 07/26/20 1113  LATICACIDVEN 1.3 1.4    Recent Results (from the past 240 hour(s))  Resp Panel by RT-PCR (Flu A&B, Covid) Nasopharyngeal Swab     Status: None   Collection Time: 07/26/20 10:16 AM   Specimen: Nasopharyngeal Swab; Nasopharyngeal(NP) swabs in vial transport medium  Result Value Ref Range Status   SARS Coronavirus 2 by RT PCR NEGATIVE NEGATIVE Final    Comment: (NOTE) SARS-CoV-2 target nucleic acids are NOT DETECTED.  The SARS-CoV-2 RNA is generally detectable in upper respiratory specimens during the acute phase of infection. The lowest concentration of SARS-CoV-2 viral copies this assay can detect is 138 copies/mL. A negative result does not preclude SARS-Cov-2 infection and should not be used as the sole basis for treatment or other patient management decisions. A negative result may occur with  improper specimen collection/handling, submission of specimen other than nasopharyngeal swab, presence of viral mutation(s) within the areas targeted by this assay, and inadequate number of viral copies(<138 copies/mL). A negative result must be combined with clinical  observations, patient history, and epidemiological information. The expected result is Negative.  Fact Sheet for Patients:  BloggerCourse.comhttps://www.fda.gov/media/152166/download  Fact Sheet for Healthcare Providers:  SeriousBroker.ithttps://www.fda.gov/media/152162/download  This test is no t yet approved or cleared by the [place] FDA and  has been authorized for detection and/or diagnosis of SARS-CoV-2 by FDA under an Emergency Use Authorization (EUA). This EUA will remain  in effect (meaning this test can be used) for the duration of the COVID-19 declaration under Section 564(b)(1) of the Act, 21 U.S.C.section 360bbb-3(b)(1), unless the authorization is terminated  or revoked sooner.       Influenza A by PCR NEGATIVE NEGATIVE Final   Influenza B by PCR NEGATIVE NEGATIVE Final    Comment: (NOTE) The Xpert  Xpress SARS-CoV-2/FLU/RSV plus assay is intended as an aid in the diagnosis of influenza from Nasopharyngeal swab specimens and should not be used as a sole basis for treatment. Nasal washings and aspirates are unacceptable for Xpert Xpress SARS-CoV-2/FLU/RSV testing.  Fact Sheet for Patients: BloggerCourse.com  Fact Sheet for Healthcare Providers: SeriousBroker.it  This test is not yet approved or cleared by the [place] FDA and has been authorized for detection and/or diagnosis of SARS-CoV-2 by FDA under an Emergency Use Authorization (EUA). This EUA will remain in effect (meaning this test can be used) for the duration of the COVID-19 declaration under Section 564(b)(1) of the Act, 21 U.S.C. section 360bbb-3(b)(1), unless the authorization is terminated or revoked.  Performed at [facility], [address], [place], [place] [address]   Blood Culture (routine x 2)     Status: None (Preliminary result)   Collection Time: 07/26/20 11:13 AM   Specimen: BLOOD  Result Value Ref Range Status   Specimen Description BLOOD LEFT  ANTECUBITAL  Final   Special Requests   Final    BOTTLES DRAWN AEROBIC AND ANAEROBIC Blood Culture results may not be optimal due to an excessive volume of blood received in culture bottles   Culture  Setup Time   Final    GRAM NEGATIVE RODS AEROBIC BOTTLE ONLY CRITICAL VALUE NOTED.  VALUE IS CONSISTENT WITH PREVIOUSLY REPORTED AND CALLED VALUE. Performed at [facility], [address] [place], [place] [address]    Culture GRAM NEGATIVE RODS  Final   Report Status PENDING  Incomplete  Blood Culture (routine x 2)     Status: Abnormal (Preliminary result)   Collection Time: 07/26/20 11:13 AM   Specimen: BLOOD LEFT HAND  Result Value Ref Range Status   Specimen Description BLOOD LEFT HAND  Final   Special Requests   Final    BOTTLES DRAWN AEROBIC AND ANAEROBIC Blood Culture adequate volume   Culture  Setup Time   Final    GRAM NEGATIVE RODS AEROBIC BOTTLE ONLY CRITICAL RESULT CALLED TO, READ BACK BY AND VERIFIED WITHMelven Sartorius PHARMD, AT 0224 07/28/20 DV    Culture (A)  Final    HAEMOPHILUS INFLUENZAE HEALTH DEPARTMENT NOTIFIED Performed at [facility], [address] [place], [place] [address]    Report Status PENDING  Incomplete  Blood Culture ID Panel (Reflexed)     Status: Abnormal   Collection Time: 07/26/20 11:13 AM  Result Value Ref Range Status   Enterococcus faecalis NOT DETECTED NOT DETECTED Final   Enterococcus Faecium NOT DETECTED NOT DETECTED Final   Listeria monocytogenes NOT DETECTED NOT DETECTED Final   Staphylococcus species NOT DETECTED NOT DETECTED Final   Staphylococcus aureus (BCID) NOT DETECTED NOT DETECTED Final   Staphylococcus epidermidis NOT DETECTED NOT DETECTED Final   Staphylococcus lugdunensis NOT DETECTED NOT DETECTED Final   Streptococcus species NOT DETECTED NOT DETECTED Final   Streptococcus agalactiae NOT DETECTED NOT DETECTED Final   Streptococcus pneumoniae NOT DETECTED NOT DETECTED Final   Streptococcus pyogenes NOT  DETECTED NOT DETECTED Final   A.calcoaceticus-baumannii NOT DETECTED NOT DETECTED Final   Bacteroides fragilis NOT DETECTED NOT DETECTED Final   Enterobacterales NOT DETECTED NOT DETECTED Final   Enterobacter cloacae complex NOT DETECTED NOT DETECTED Final   Escherichia coli NOT DETECTED NOT DETECTED Final   Klebsiella aerogenes NOT DETECTED NOT DETECTED Final   Klebsiella oxytoca NOT DETECTED NOT DETECTED Final   Klebsiella pneumoniae NOT DETECTED NOT DETECTED Final  Proteus species NOT DETECTED NOT DETECTED Final   Salmonella species NOT DETECTED NOT DETECTED Final   Serratia marcescens NOT DETECTED NOT DETECTED Final   Haemophilus influenzae DETECTED (A) NOT DETECTED Final    Comment: CRITICAL RESULT CALLED TO, READ BACK BY AND VERIFIED WITH: J. LEDFORD PHARMD, AT 0224 07/28/20 DV    Neisseria meningitidis NOT DETECTED NOT DETECTED Final   Pseudomonas aeruginosa NOT DETECTED NOT DETECTED Final   Stenotrophomonas maltophilia NOT DETECTED NOT DETECTED Final   Candida albicans NOT DETECTED NOT DETECTED Final   Candida auris NOT DETECTED NOT DETECTED Final   Candida glabrata NOT DETECTED NOT DETECTED Final   Candida krusei NOT DETECTED NOT DETECTED Final   Candida parapsilosis NOT DETECTED NOT DETECTED Final   Candida tropicalis NOT DETECTED NOT DETECTED Final   Cryptococcus neoformans/gattii NOT DETECTED NOT DETECTED Final    Comment: Performed at [facility], [address], [place], [place] [address]     Radiology Studies: No results found.  Scheduled Meds:  amLODipine  5 mg Oral Daily   aspirin EC  81 mg Oral Daily   cholecalciferol  1,000 Units Oral Daily   enoxaparin (LOVENOX) injection  40 mg Subcutaneous Daily   fluticasone furoate-vilanterol  1 puff Inhalation Daily   folic acid  0.5 mg Oral Daily   guaiFENesin  1,200 mg Oral BID   ipratropium-albuterol  3 mL Nebulization BID   lactose free nutrition  237 mL Oral Q24H   multivitamin with minerals  1 tablet  Oral Daily   predniSONE  40 mg Oral Q breakfast   Continuous Infusions:  cefTRIAXone (ROCEPHIN)  IV 2 g (07/28/20 1029)     LOS: 2 days   Darlin Priestly, MD [facility] Pager On Amion  If 7PM-7AM, please contact night-coverage 07/28/2020, 5:21 PM

## 2020-07-29 LAB — BASIC METABOLIC PANEL
Anion gap: 6 (ref 5–15)
BUN: 18 mg/dL (ref 8–23)
CO2: 35 mmol/L — ABNORMAL HIGH (ref 22–32)
Calcium: 8.9 mg/dL (ref 8.9–10.3)
Chloride: 94 mmol/L — ABNORMAL LOW (ref 98–111)
Creatinine, Ser: 0.62 mg/dL (ref 0.44–1.00)
GFR, Estimated: >60 mL/min (ref >60)
Glucose, Bld: 110 mg/dL — ABNORMAL HIGH (ref 70–99)
Potassium: 3.8 mmol/L (ref 3.5–5.1)
Sodium: 135 mmol/L (ref 135–145)

## 2020-07-29 LAB — CBC
HCT: 33.4 % — ABNORMAL LOW (ref 36.0–46.0)
Hemoglobin: 11.1 g/dL — ABNORMAL LOW (ref 12.0–15.0)
MCH: 30.6 pg (ref 26.0–34.0)
MCHC: 33.2 g/dL (ref 30.0–36.0)
MCV: 92 fL (ref 80.0–100.0)
Platelets: 356 10*3/uL (ref 150–400)
RBC: 3.63 MIL/uL — ABNORMAL LOW (ref 3.87–5.11)
RDW: 14.6 % (ref 11.5–15.5)
WBC: 20.2 10*3/uL — ABNORMAL HIGH (ref 4.0–10.5)
nRBC: 0.1 % (ref 0.0–0.2)

## 2020-07-29 LAB — PROCALCITONIN: Procalcitonin: 0.29 ng/mL

## 2020-07-29 LAB — MAGNESIUM: Magnesium: 2.2 mg/dL (ref 1.7–2.4)

## 2020-07-29 MED ORDER — ESCITALOPRAM OXALATE 20 MG PO TABS
20.0000 mg | ORAL_TABLET | Freq: Every day | ORAL | Status: DC
  Administered 2020-07-29 – 2020-08-03 (×6): 20 mg via ORAL
  Filled 2020-07-29 (×6): qty 1

## 2020-07-29 NOTE — Progress Notes (Signed)
Occupational Therapy Treatment Patient Details Name: Lisa Owen MRN: [id number] DOB: 16-Dec-1950 Today's Date: 07/29/2020    History of present illness 70 y.o. female presenting to ED on 6/20 with worsening SOB, productive cough and wheezing. SpO2 in 70's when EMS arrived. Patient admitted with PNA, acute hypoxic respiratory failure and acute asthma exacerbation. PMHx significant for mild persistent asthma, HTN and osteopenia.   OT comments  OT treatment session with focus on energy conservation during ADL tasks. Patient continues to become SOB with light activity including short-distance mobility to commode in bathroom requiring more than increased time. Education provided on sitting to complete grooming tasks, use of DME, and taking rest breaks as needed. Patient expressed verbal understanding. Patient only able to recall 1/3 energy conservation techniques at conclusion of treatment session. OT to provide written handout at time of next visit.   SpO2 84% on 4L upon entry SpO2 88% on 8L and HR 120bpm with mobility/ADLs in sitting/standing  SpO2 91% on 8L at conclusion of session    Follow Up Recommendations  No OT follow up;Supervision - Intermittent    Equipment Recommendations  None recommended by OT (Patient has necessary DME)    Recommendations for Other Services      Precautions / Restrictions Precautions Precautions: Other (comment) Precaution Comments: Monitor vitals Restrictions Weight Bearing Restrictions: No       Mobility Bed Mobility Overal bed mobility: Modified Independent             General bed mobility comments: Increased time/effort secondary to SOB. Cues for pacing.    Transfers Overall transfer level: Needs assistance Equipment used: None Transfers: Sit to/from Stand Sit to Stand: Supervision         General transfer comment: Supervision A for line management only.    Balance Overall balance assessment: Mild deficits observed, not formally  tested                                         ADL either performed or assessed with clinical judgement   ADL Overall ADL's : Needs assistance/impaired     Grooming: Supervision/safety;Standing Grooming Details (indicate cue type and reason): Supervision A for hand hygiene standing at sink level for line management only.                 Toilet Transfer: Firefighter Details (indicate cue type and reason): To BSC over standard toilet with supervision A for line management. Toileting- Clothing Manipulation and Hygiene: Supervision/safety;Sit to/from stand Toileting - Clothing Manipulation Details (indicate cue type and reason): Supervision A for line management. Cues for activity pacing and energy conservation.     Functional mobility during ADLs: Supervision/safety General ADL Comments: Patient greatly limited by SOB and decreased activity tolernace (although improved from previous session).     Vision   Vision Assessment?: No apparent visual deficits   Perception     Praxis      Cognition Arousal/Alertness: Awake/alert Behavior During Therapy: WFL for tasks assessed/performed Overall Cognitive Status: Within Functional Limits for tasks assessed                                          Exercises     Shoulder Instructions       General Comments Upon entry, patient on 4L  O2 via [place] with SpO2 84% seated EOB. O2 titrated up to 6L on portable tank with SpO2 recovering to 87%. Titrated O2 to 8L for mobility. With coughing fit, patient desat to 88%. O2 91% on 8L at conclusion of session. RN notified to titrate down as appropriate with recovery.    Pertinent Vitals/ Pain          Home Living                                          Prior Functioning/Environment              Frequency  Min 2X/week        Progress Toward Goals  OT Goals(current goals can now be found in the care  plan section)  Progress towards OT goals: Progressing toward goals  Acute Rehab OT Goals Patient Stated Goal: To feel better. OT Goal Formulation: With patient Time For Goal Achievement: 08/10/20 Potential to Achieve Goals: Good ADL Goals Additional ADL Goal #1: Patient will complete ADLs with Mod I and good carryover of energy conservation techniques. Additional ADL Goal #2: Patient will tolerate 15 minutes of therapeutic activity without need for rest break indicating increased activity tolerance. Additional ADL Goal #3: Patient will recall and demo 3 energy conservation techniques in prep for ADLs.  Plan Discharge plan remains appropriate;Frequency remains appropriate    Co-evaluation                 AM-PAC OT "6 Clicks" Daily Activity     Outcome Measure   Help from another person eating meals?: None Help from another person taking care of personal grooming?: A Little Help from another person toileting, which includes using toliet, bedpan, or urinal?: A Little Help from another person bathing (including washing, rinsing, drying)?: A Little Help from another person to put on and taking off regular upper body clothing?: A Little Help from another person to put on and taking off regular lower body clothing?: A Little 6 Click Score: 19    End of Session Equipment Utilized During Treatment: Gait belt;Oxygen  OT Visit Diagnosis: Muscle weakness (generalized) (M62.81)   Activity Tolerance Patient tolerated treatment well;Patient limited by fatigue   Patient Left in chair;with call bell/phone within reach;with chair alarm set;with family/visitor present   Nurse Communication Mobility status;Other (comment) (Need for anxiety medication.)        Time: 5176-1607 OT Time Calculation (min): 39 min  Charges: OT General Charges $OT Visit: 1 Visit OT Treatments $Self Care/Home Management : 23-37 mins $Therapeutic Activity: 8-22 mins  Houa Nie H. OTR/L Supplemental OT,  Department of rehab services [phone]   Wessley Emert R H. 07/29/2020, 8:22 AM

## 2020-07-29 NOTE — Progress Notes (Signed)
Physical Therapy Treatment Patient Details Name: Lisa Owen MRN: [id number] DOB: [birth year] Today's Date: 07/29/2020    History of Present Illness 70 y.o. female presenting to ED on 6/20 with worsening SOB, productive cough and wheezing. SpO2 in 70's when EMS arrived. Patient admitted with PNA, acute hypoxic respiratory failure and acute asthma exacerbation. PMHx significant for mild persistent asthma, HTN and osteopenia.    PT Comments    Patient tolerated ambulation well, with first trial on 8L and able to decr to 6L via portable tank for second trial with sats maintaining 93-95%.  Pt instructed in use of rollator and appreciated that it made it easier/safer for her when walking.   Follow Up Recommendations  Home health PT;Supervision - Intermittent     Equipment Recommendations  None recommended by PT    Recommendations for Other Services       Precautions / Restrictions Precautions Precautions: Other (comment) Precaution Comments: Monitor vitals    Mobility  Bed Mobility                    Transfers Overall transfer level: Needs assistance Equipment used: None;4-wheeled walker Transfers: Sit to/from Stand Sit to Stand: Supervision         General transfer comment: vc for use of rollator and how to operate brakes  Ambulation/Gait Ambulation/Gait assistance: Min guard Gait Distance (Feet): 40 Feet (10, 10 ft) Assistive device: 4-wheeled walker Gait Pattern/deviations: Decreased stride length;Step-through pattern Gait velocity: decreased   General Gait Details: vc for use of rollator x 3 (proximity, how to enter bathroom, how to use at sink when washing hands); pt did not want to walk into hall due to need to wear a mask, therefore laps done in room; plus trip to/from restroom   Stairs             Wheelchair Mobility    Modified Rankin (Stroke Patients Only)       Balance Overall balance assessment: Mild deficits observed, not formally  tested Sitting-balance support: No upper extremity supported Sitting balance-Leahy Scale: Good     Standing balance support: No upper extremity supported Standing balance-Leahy Scale: Good                              Cognition Arousal/Alertness: Awake/alert Behavior During Therapy: WFL for tasks assessed/performed Overall Cognitive Status: Within Functional Limits for tasks assessed                                        Exercises      General Comments General comments (skin integrity, edema, etc.): on 8L with sats 96-98% while walking to bathroom; decr to 6L for walking in room with sats 93-95%      Pertinent Vitals/Pain Pain Assessment: No/denies pain    Home Living                      Prior Function            PT Goals (current goals can now be found in the care plan section) Acute Rehab PT Goals Patient Stated Goal: To feel better. PT Goal Formulation: With patient Time For Goal Achievement: 08/10/20 Potential to Achieve Goals: Good Progress towards PT goals: Progressing toward goals    Frequency    Min 3X/week      PT  Plan Current plan remains appropriate    Co-evaluation              AM-PAC PT "6 Clicks" Mobility   Outcome Measure  Help needed turning from your back to your side while in a flat bed without using bedrails?: A Little Help needed moving from lying on your back to sitting on the side of a flat bed without using bedrails?: A Little Help needed moving to and from a bed to a chair (including a wheelchair)?: A Little Help needed standing up from a chair using your arms (e.g., wheelchair or bedside chair)?: A Little Help needed to walk in hospital room?: A Little Help needed climbing 3-5 steps with a railing? : A Little 6 Click Score: 18    End of Session Equipment Utilized During Treatment: Oxygen Activity Tolerance: Patient tolerated treatment well Patient left: in chair;with family/visitor  present;with call bell/phone within reach Nurse Communication: Mobility status PT Visit Diagnosis: Other abnormalities of gait and mobility (R26.89)     Time: 1009-1040 PT Time Calculation (min) (ACUTE ONLY): 31 min  Charges:  $Gait Training: 23-37 mins                      Lisa Owen, PT Pager [phone]    [name] 07/29/2020, 12:13 PM

## 2020-07-29 NOTE — Plan of Care (Signed)
  Problem: Education: Goal: Knowledge of General Education information will improve Description Including pain rating scale, medication(s)/side effects and non-pharmacologic comfort measures Outcome: Progressing   

## 2020-07-29 NOTE — Progress Notes (Signed)
[facility]   Reason for visit: Follow up on bacteremia  Interval History: Haemophilus influenza identified on blood cultures as well.  WBC stable at 20.2.  remains afebrile.   Day 4 ceftriaxone  Physical Exam: Constitutional:  Vitals:   07/29/20 0835 07/29/20 1054  BP: (!) 143/80   Pulse: 90   Resp: 18   Temp: 98.1 F (36.7 C)   SpO2: 96% 95%   patient appears in NAD Respiratory: Normal respiratory effort;+ mild wheezes bilatera Cardiovascular: tachy RR  Review of Systems: Constitutional: negative for fevers and chills Gastrointestinal: negative for nausea and diarrhea  Lab Results  Component Value Date   WBC 20.2 (H) 07/29/2020   HGB 11.1 (L) 07/29/2020   HCT 33.4 (L) 07/29/2020   MCV 92.0 07/29/2020   PLT 356 07/29/2020    Lab Results  Component Value Date   CREATININE 0.62 07/29/2020   BUN 18 07/29/2020   NA 135 07/29/2020   K 3.8 07/29/2020   CL 94 (L) 07/29/2020   CO2 35 (H) 07/29/2020    Lab Results  Component Value Date   ALT 29 07/28/2020   AST 20 07/28/2020   ALKPHOS 81 07/28/2020     Microbiology: Recent Results (from the past 240 hour(s))  Resp Panel by RT-PCR (Flu A&B, Covid) Nasopharyngeal Swab     Status: None   Collection Time: 07/26/20 10:16 AM   Specimen: Nasopharyngeal Swab; Nasopharyngeal(NP) swabs in vial transport medium  Result Value Ref Range Status   SARS Coronavirus 2 by RT PCR NEGATIVE NEGATIVE Final    Comment: (NOTE) SARS-CoV-2 target nucleic acids are NOT DETECTED.  The SARS-CoV-2 RNA is generally detectable in upper respiratory specimens during the acute phase of infection. The lowest concentration of SARS-CoV-2 viral copies this assay can detect is 138 copies/mL. A negative result does not preclude SARS-Cov-2 infection and should not be used as the sole basis for treatment or other patient management decisions. A negative result may occur with  improper specimen collection/handling,  submission of specimen other than nasopharyngeal swab, presence of viral mutation(s) within the areas targeted by this assay, and inadequate number of viral copies(<138 copies/mL). A negative result must be combined with clinical observations, patient history, and epidemiological information. The expected result is Negative.  Fact Sheet for Patients:  BloggerCourse.com  Fact Sheet for Healthcare Providers:  SeriousBroker.it  This test is no t yet approved or cleared by the [place] FDA and  has been authorized for detection and/or diagnosis of SARS-CoV-2 by FDA under an Emergency Use Authorization (EUA). This EUA will remain  in effect (meaning this test can be used) for the duration of the COVID-19 declaration under Section 564(b)(1) of the Act, 21 U.S.C.section 360bbb-3(b)(1), unless the authorization is terminated  or revoked sooner.       Influenza A by PCR NEGATIVE NEGATIVE Final   Influenza B by PCR NEGATIVE NEGATIVE Final    Comment: (NOTE) The Xpert Xpress SARS-CoV-2/FLU/RSV plus assay is intended as an aid in the diagnosis of influenza from Nasopharyngeal swab specimens and should not be used as a sole basis for treatment. Nasal washings and aspirates are unacceptable for Xpert Xpress SARS-CoV-2/FLU/RSV testing.  Fact Sheet for Patients: BloggerCourse.com  Fact Sheet for Healthcare Providers: SeriousBroker.it  This test is not yet approved or cleared by the [place] FDA and has been authorized for detection and/or diagnosis of SARS-CoV-2 by FDA under an Emergency Use Authorization (EUA). This EUA will remain in effect (  meaning this test can be used) for the duration of the COVID-19 declaration under Section 564(b)(1) of the Act, 21 U.S.C. section 360bbb-3(b)(1), unless the authorization is terminated or revoked.  Performed at [facility], [address], [place], [place] [address]   Blood Culture (routine x 2)     Status: Abnormal (Preliminary result)   Collection Time: 07/26/20 11:13 AM   Specimen: BLOOD  Result Value Ref Range Status   Specimen Description BLOOD LEFT ANTECUBITAL  Final   Special Requests   Final    BOTTLES DRAWN AEROBIC AND ANAEROBIC Blood Culture results may not be optimal due to an excessive volume of blood received in culture bottles   Culture  Setup Time   Final    GRAM NEGATIVE RODS AEROBIC BOTTLE ONLY CRITICAL VALUE NOTED.  VALUE IS CONSISTENT WITH PREVIOUSLY REPORTED AND CALLED VALUE. Performed at [facility], [address], [place], [place] [address]    Culture HAEMOPHILUS INFLUENZAE (A)  Final   Report Status PENDING  Incomplete  Blood Culture (routine x 2)     Status: Abnormal (Preliminary result)   Collection Time: 07/26/20 11:13 AM   Specimen: BLOOD LEFT HAND  Result Value Ref Range Status   Specimen Description BLOOD LEFT HAND  Final   Special Requests   Final    BOTTLES DRAWN AEROBIC AND ANAEROBIC Blood Culture adequate volume   Culture  Setup Time   Final    GRAM NEGATIVE RODS AEROBIC BOTTLE ONLY CRITICAL RESULT CALLED TO, READ BACK BY AND VERIFIED WITHMelven Sartorius PHARMD, AT 0224 07/28/20 DV    Culture (A)  Final    HAEMOPHILUS INFLUENZAE HEALTH DEPARTMENT NOTIFIED Performed at [facility], [address] [place], [place] [address]    Report Status PENDING  Incomplete  Blood Culture ID Panel (Reflexed)     Status: Abnormal   Collection Time: 07/26/20 11:13 AM  Result Value Ref Range Status   Enterococcus faecalis NOT DETECTED NOT DETECTED Final   Enterococcus Faecium NOT DETECTED NOT DETECTED Final   Listeria monocytogenes NOT DETECTED NOT DETECTED Final   Staphylococcus species NOT DETECTED NOT DETECTED Final   Staphylococcus aureus (BCID) NOT DETECTED NOT DETECTED Final   Staphylococcus epidermidis NOT DETECTED NOT DETECTED Final   Staphylococcus lugdunensis NOT  DETECTED NOT DETECTED Final   Streptococcus species NOT DETECTED NOT DETECTED Final   Streptococcus agalactiae NOT DETECTED NOT DETECTED Final   Streptococcus pneumoniae NOT DETECTED NOT DETECTED Final   Streptococcus pyogenes NOT DETECTED NOT DETECTED Final   A.calcoaceticus-baumannii NOT DETECTED NOT DETECTED Final   Bacteroides fragilis NOT DETECTED NOT DETECTED Final   Enterobacterales NOT DETECTED NOT DETECTED Final   Enterobacter cloacae complex NOT DETECTED NOT DETECTED Final   Escherichia coli NOT DETECTED NOT DETECTED Final   Klebsiella aerogenes NOT DETECTED NOT DETECTED Final   Klebsiella oxytoca NOT DETECTED NOT DETECTED Final   Klebsiella pneumoniae NOT DETECTED NOT DETECTED Final   Proteus species NOT DETECTED NOT DETECTED Final   Salmonella species NOT DETECTED NOT DETECTED Final   Serratia marcescens NOT DETECTED NOT DETECTED Final   Haemophilus influenzae DETECTED (A) NOT DETECTED Final    Comment: CRITICAL RESULT CALLED TO, READ BACK BY AND VERIFIED WITH: J. LEDFORD PHARMD, AT 0224 07/28/20 DV    Neisseria meningitidis NOT DETECTED NOT DETECTED Final   Pseudomonas aeruginosa NOT DETECTED NOT DETECTED Final   Stenotrophomonas maltophilia NOT DETECTED NOT DETECTED Final   Candida albicans NOT DETECTED NOT  DETECTED Final   Candida auris NOT DETECTED NOT DETECTED Final   Candida glabrata NOT DETECTED NOT DETECTED Final   Candida krusei NOT DETECTED NOT DETECTED Final   Candida parapsilosis NOT DETECTED NOT DETECTED Final   Candida tropicalis NOT DETECTED NOT DETECTED Final   Cryptococcus neoformans/gattii NOT DETECTED NOT DETECTED Final    Comment: Performed at [facility], [address], [place], [place] [address]    Impression/Plan:  1. H influenza bacteremia - c/w her pneumonia and on appropriate treatment.  She continue to have slow improvement and will continue with ceftriaxone.   With her continued oxygen needs, will plan on 7 days total of  antibiotics Can use oral therapy at discharge if she is discharged prior to 7 days Waiting for ? Beta lactamase status  2.  Leukocytosis - remains elevated likely secondary to steroids.  Will continue to monitor  3.  Hypoxia - continues on high flow nasal cannula but some slow improvement.  Patient more ambulatory and down to 7 liters.

## 2020-07-29 NOTE — Plan of Care (Signed)

## 2020-07-29 NOTE — Progress Notes (Signed)
RT NOTES: Pt on 8L nasal cannula. Changed to a Salter HFNC at 8L. Will continue to monitor.

## 2020-07-29 NOTE — Progress Notes (Addendum)
PROGRESS NOTE    Lisa Owen  VOZ:[id number] DOB: 17-Aug-1950 DOA: 07/26/2020 PCP: Blair Heys, MD    Brief Narrative:  70 y.o. female with medical history significant of mild persistent asthma, HTN, osteopenia, presented with asthma-like symptoms.  Assessment & Plan:   Active Problems:   Hypoxia   Pneumonia   PNA (pneumonia)  Acute hypoxic respiratory failure 2/2 Right LL PNA --started on ceftriaxone and Zithromax on admission, azithro since d/c'ed by ID. --O2 requirement increased to 7L this morning. Plan: --cont ceftriaxone, will treat for 7 days, per ID --Continue supplemental O2 to keep sats >=92%, wean as tolerated  H influenza bacteremia --ID auto-consulted --cont ceftriaxone   Suspected Acute asthma exacerbation vs undiagnosed COPD exacerbation 2nd hand smoke --cont bronchodilators --cont prednisone --outpatient pulm followup  Acute hypercapnic respiratory failure -Pt noted to be hypercarbic, requiring bipap support --BiPAP nightly and PRN   Hyponatremia -HCTZ was changed to amlodipine at time of presentation   Hypertension -HCTZ was changed to amlodipine at time of presentation --BP has been elevated Plan: --increase amlodipine to 10 mg daily --add Lisinopril 10 mg daily  Anxiety --resume home lexapro today    DVT prophylaxis: Lovenox subq Code Status: Full Family Communication:  Status is: Inpatient  Remains inpatient appropriate because:Inpatient level of care appropriate due to severity of illness  Dispo: The patient is from: Home              Anticipated d/c is to: Home              Patient currently is not medically stable to d/c.  On IV abx for bacteremia.  7L O2 new.    Difficult to place patient No    Consultants:    Procedures:    Antimicrobials: Anti-infectives (From admission, onward)    Start     Dose/Rate Route Frequency Ordered Stop   07/26/20 1030  cefTRIAXone (ROCEPHIN) 2 g in sodium chloride 0.9 % 100 mL IVPB         2 g 200 mL/hr over 30 Minutes Intravenous Every 24 hours 07/26/20 1016     07/26/20 1030  azithromycin (ZITHROMAX) 500 mg in sodium chloride 0.9 % 250 mL IVPB  Status:  Discontinued        500 mg 250 mL/hr over 60 Minutes Intravenous Every 24 hours 07/26/20 1016 07/28/20 0936       Subjective: O requirement increased to 7L this morning.    Pt reported feeling dry due to O2 flow, improved after humidifier chamber added.  Ate soup.  Had BM's.    Objective: Vitals:   07/29/20 0825 07/29/20 0826 07/29/20 0835 07/29/20 1054  BP:   (!) 143/80   Pulse:   90   Resp:   18   Temp:   98.1 F (36.7 C)   TempSrc:   Oral   SpO2: 97% 97% 96% 95%  Weight:      Height:        Intake/Output Summary (Last 24 hours) at 07/29/2020 1759 Last data filed at 07/28/2020 1833 Gross per 24 hour  Intake 240 ml  Output --  Net 240 ml   Filed Weights   07/26/20 1024  Weight: 71.2 kg    Examination: Constitutional: NAD, AAOx3 HEENT: conjunctivae and lids normal, EOMI CV: No cyanosis.   RESP: normal respiratory effort, on 6L Extremities: No effusions, edema in BLE SKIN: warm, dry Neuro: II - XII grossly intact.   Psych: Normal mood and affect.  Appropriate judgement  and reason   Data Reviewed: I have personally reviewed following labs and imaging studies  CBC: Recent Labs  Lab 07/26/20 1058 07/26/20 1127 07/27/20 0102 07/28/20 0114 07/29/20 0159  WBC 22.8*  --  17.6* 19.4* 20.2*  NEUTROABS 18.9*  --   --   --   --   HGB 13.0 14.3 11.7* 11.2* 11.1*  HCT 38.3 42.0 34.7* 32.6* 33.4*  MCV 91.6  --  92.3 91.8 92.0  PLT 373  --  317 347 356   Basic Metabolic Panel: Recent Labs  Lab 07/26/20 1058 07/26/20 1127 07/27/20 0102 07/28/20 0114 07/29/20 0159  NA 129* 128* 130* 131* 135  K 3.5 3.5 3.7 3.8 3.8  CL 91* 94* 92* 93* 94*  CO2 27  --  29 30 35*  GLUCOSE 139* 141* 143* 137* 110*  BUN 23 31* 29* 30* 18  CREATININE 0.82 0.80 0.98 0.78 0.62  CALCIUM 9.1  --  9.0 9.1 8.9   MG  --   --   --   --  2.2   GFR: Estimated Creatinine Clearance: 66.6 mL/min (by C-G formula based on SCr of 0.62 mg/dL). Liver Function Tests: Recent Labs  Lab 07/26/20 1058 07/28/20 0114  AST 26 20  ALT 29 29  ALKPHOS 98 81  BILITOT 0.8 0.6  PROT 7.2 6.3*  ALBUMIN 2.9* 2.5*   No results for input(s): LIPASE, AMYLASE in the last 168 hours. No results for input(s): AMMONIA in the last 168 hours. Coagulation Profile: Recent Labs  Lab 07/26/20 1058  INR 1.1   Cardiac Enzymes: No results for input(s): CKTOTAL, CKMB, CKMBINDEX, TROPONINI in the last 168 hours. BNP (last 3 results) No results for input(s): PROBNP in the last 8760 hours. HbA1C: No results for input(s): HGBA1C in the last 72 hours. CBG: No results for input(s): GLUCAP in the last 168 hours. Lipid Profile: No results for input(s): CHOL, HDL, LDLCALC, TRIG, CHOLHDL, LDLDIRECT in the last 72 hours. Thyroid Function Tests: No results for input(s): TSH, T4TOTAL, FREET4, T3FREE, THYROIDAB in the last 72 hours. Anemia Panel: No results for input(s): VITAMINB12, FOLATE, FERRITIN, TIBC, IRON, RETICCTPCT in the last 72 hours. Sepsis Labs: Recent Labs  Lab 07/26/20 1058 07/26/20 1113 07/29/20 0159  PROCALCITON  --   --  0.29  LATICACIDVEN 1.3 1.4  --     Recent Results (from the past 240 hour(s))  Resp Panel by RT-PCR (Flu A&B, Covid) Nasopharyngeal Swab     Status: None   Collection Time: 07/26/20 10:16 AM   Specimen: Nasopharyngeal Swab; Nasopharyngeal(NP) swabs in vial transport medium  Result Value Ref Range Status   SARS Coronavirus 2 by RT PCR NEGATIVE NEGATIVE Final    Comment: (NOTE) SARS-CoV-2 target nucleic acids are NOT DETECTED.  The SARS-CoV-2 RNA is generally detectable in upper respiratory specimens during the acute phase of infection. The lowest concentration of SARS-CoV-2 viral copies this assay can detect is 138 copies/mL. A negative result does not preclude SARS-Cov-2 infection and  should not be used as the sole basis for treatment or other patient management decisions. A negative result may occur with  improper specimen collection/handling, submission of specimen other than nasopharyngeal swab, presence of viral mutation(s) within the areas targeted by this assay, and inadequate number of viral copies(<138 copies/mL). A negative result must be combined with clinical observations, patient history, and epidemiological information. The expected result is Negative.  Fact Sheet for Patients:  BloggerCourse.com  Fact Sheet for Healthcare Providers:  SeriousBroker.it  This  test is no t yet approved or cleared by the [place] and  has been authorized for detection and/or diagnosis of SARS-CoV-2 by FDA under an Emergency Use Authorization (EUA). This EUA will remain  in effect (meaning this test can be used) for the duration of the COVID-19 declaration under Section 564(b)(1) of the Act, 21 U.S.C.section 360bbb-3(b)(1), unless the authorization is terminated  or revoked sooner.       Influenza A by PCR NEGATIVE NEGATIVE Final   Influenza B by PCR NEGATIVE NEGATIVE Final    Comment: (NOTE) The Xpert Xpress SARS-CoV-2/FLU/RSV plus assay is intended as an aid in the diagnosis of influenza from Nasopharyngeal swab specimens and should not be used as a sole basis for treatment. Nasal washings and aspirates are unacceptable for Xpert Xpress SARS-CoV-2/FLU/RSV testing.  Fact Sheet for Patients: BloggerCourse.com  Fact Sheet for Healthcare Providers: SeriousBroker.it  This test is not yet approved or cleared by the [place] FDA and has been authorized for detection and/or diagnosis of SARS-CoV-2 by FDA under an Emergency Use Authorization (EUA). This EUA will remain in effect (meaning this test can be used) for the duration of the COVID-19 declaration  under Section 564(b)(1) of the Act, 21 U.S.C. section 360bbb-3(b)(1), unless the authorization is terminated or revoked.  Performed at [facility], [address], [place], [place] [address]   Blood Culture (routine x 2)     Status: Abnormal (Preliminary result)   Collection Time: 07/26/20 11:13 AM   Specimen: BLOOD  Result Value Ref Range Status   Specimen Description BLOOD LEFT ANTECUBITAL  Final   Special Requests   Final    BOTTLES DRAWN AEROBIC AND ANAEROBIC Blood Culture results may not be optimal due to an excessive volume of blood received in culture bottles   Culture  Setup Time   Final    GRAM NEGATIVE RODS AEROBIC BOTTLE ONLY CRITICAL VALUE NOTED.  VALUE IS CONSISTENT WITH PREVIOUSLY REPORTED AND CALLED VALUE.    Culture (A)  Final    HAEMOPHILUS INFLUENZAE BETA LACTAMASE POSITIVE Performed at [facility], [address], [place], [place] [address]    Report Status PENDING  Incomplete  Blood Culture (routine x 2)     Status: Abnormal (Preliminary result)   Collection Time: 07/26/20 11:13 AM   Specimen: BLOOD LEFT HAND  Result Value Ref Range Status   Specimen Description BLOOD LEFT HAND  Final   Special Requests   Final    BOTTLES DRAWN AEROBIC AND ANAEROBIC Blood Culture adequate volume   Culture  Setup Time   Final    GRAM NEGATIVE RODS AEROBIC BOTTLE ONLY CRITICAL RESULT CALLED TO, READ BACK BY AND VERIFIED WITHMelven Sartorius PHARMD, AT 0224 07/28/20 DV    Culture (A)  Final    HAEMOPHILUS INFLUENZAE BETA LACTAMASE POSITIVE HEALTH DEPARTMENT NOTIFIED Performed at [facility], [address] [place], [place] [address]    Report Status PENDING  Incomplete  Blood Culture ID Panel (Reflexed)     Status: Abnormal   Collection Time: 07/26/20 11:13 AM  Result Value Ref Range Status   Enterococcus faecalis NOT DETECTED NOT DETECTED Final   Enterococcus Faecium NOT DETECTED NOT DETECTED Final   Listeria monocytogenes NOT DETECTED NOT DETECTED  Final   Staphylococcus species NOT DETECTED NOT DETECTED Final   Staphylococcus aureus (BCID) NOT DETECTED NOT DETECTED Final   Staphylococcus epidermidis NOT DETECTED NOT DETECTED Final   Staphylococcus lugdunensis NOT DETECTED NOT DETECTED  Final   Streptococcus species NOT DETECTED NOT DETECTED Final   Streptococcus agalactiae NOT DETECTED NOT DETECTED Final   Streptococcus pneumoniae NOT DETECTED NOT DETECTED Final   Streptococcus pyogenes NOT DETECTED NOT DETECTED Final   A.calcoaceticus-baumannii NOT DETECTED NOT DETECTED Final   Bacteroides fragilis NOT DETECTED NOT DETECTED Final   Enterobacterales NOT DETECTED NOT DETECTED Final   Enterobacter cloacae complex NOT DETECTED NOT DETECTED Final   Escherichia coli NOT DETECTED NOT DETECTED Final   Klebsiella aerogenes NOT DETECTED NOT DETECTED Final   Klebsiella oxytoca NOT DETECTED NOT DETECTED Final   Klebsiella pneumoniae NOT DETECTED NOT DETECTED Final   Proteus species NOT DETECTED NOT DETECTED Final   Salmonella species NOT DETECTED NOT DETECTED Final   Serratia marcescens NOT DETECTED NOT DETECTED Final   Haemophilus influenzae DETECTED (A) NOT DETECTED Final    Comment: CRITICAL RESULT CALLED TO, READ BACK BY AND VERIFIED WITH: J. LEDFORD PHARMD, AT 0224 07/28/20 DV    Neisseria meningitidis NOT DETECTED NOT DETECTED Final   Pseudomonas aeruginosa NOT DETECTED NOT DETECTED Final   Stenotrophomonas maltophilia NOT DETECTED NOT DETECTED Final   Candida albicans NOT DETECTED NOT DETECTED Final   Candida auris NOT DETECTED NOT DETECTED Final   Candida glabrata NOT DETECTED NOT DETECTED Final   Candida krusei NOT DETECTED NOT DETECTED Final   Candida parapsilosis NOT DETECTED NOT DETECTED Final   Candida tropicalis NOT DETECTED NOT DETECTED Final   Cryptococcus neoformans/gattii NOT DETECTED NOT DETECTED Final    Comment: Performed at [facility], [address] [place], [place] [id number]     Radiology Studies: No  results found.  Scheduled Meds:  amLODipine  5 mg Oral Daily   aspirin EC  81 mg Oral Daily   cholecalciferol  1,000 Units Oral Daily   enoxaparin (LOVENOX) injection  40 mg Subcutaneous Daily   escitalopram  20 mg Oral Daily   fluticasone furoate-vilanterol  1 puff Inhalation Daily   folic acid  0.5 mg Oral Daily   guaiFENesin  1,200 mg Oral BID   ipratropium-albuterol  3 mL Nebulization BID   lactose free nutrition  237 mL Oral Q24H   multivitamin with minerals  1 tablet Oral Daily   predniSONE  40 mg Oral Q breakfast   Continuous Infusions:  cefTRIAXone (ROCEPHIN)  IV 2 g (07/29/20 0918)     LOS: 3 days   Darlin Priestlyina Jamyia Fortune, MD [facility] Pager On Amion  If 7PM-7AM, please contact night-coverage 07/29/2020, 5:59 PM

## 2020-07-30 LAB — BASIC METABOLIC PANEL
Anion gap: 8 (ref 5–15)
BUN: 20 mg/dL (ref 8–23)
CO2: 35 mmol/L — ABNORMAL HIGH (ref 22–32)
Calcium: 9 mg/dL (ref 8.9–10.3)
Chloride: 94 mmol/L — ABNORMAL LOW (ref 98–111)
Creatinine, Ser: 0.52 mg/dL (ref 0.44–1.00)
GFR, Estimated: >60 mL/min (ref >60)
Glucose, Bld: 105 mg/dL — ABNORMAL HIGH (ref 70–99)
Potassium: 4.2 mmol/L (ref 3.5–5.1)
Sodium: 137 mmol/L (ref 135–145)

## 2020-07-30 LAB — CBC
HCT: 35.1 % — ABNORMAL LOW (ref 36.0–46.0)
Hemoglobin: 11.5 g/dL — ABNORMAL LOW (ref 12.0–15.0)
MCH: 30.3 pg (ref 26.0–34.0)
MCHC: 32.8 g/dL (ref 30.0–36.0)
MCV: 92.4 fL (ref 80.0–100.0)
Platelets: 356 10*3/uL (ref 150–400)
RBC: 3.8 MIL/uL — ABNORMAL LOW (ref 3.87–5.11)
RDW: 14.5 % (ref 11.5–15.5)
WBC: 15.6 10*3/uL — ABNORMAL HIGH (ref 4.0–10.5)
nRBC: 0 % (ref 0.0–0.2)

## 2020-07-30 LAB — MAGNESIUM: Magnesium: 2.2 mg/dL (ref 1.7–2.4)

## 2020-07-30 MED ORDER — AMLODIPINE BESYLATE 10 MG PO TABS
10.0000 mg | ORAL_TABLET | Freq: Every day | ORAL | Status: DC
  Administered 2020-07-30 – 2020-08-03 (×5): 10 mg via ORAL
  Filled 2020-07-30 (×5): qty 1

## 2020-07-30 MED ORDER — LISINOPRIL 10 MG PO TABS
10.0000 mg | ORAL_TABLET | Freq: Every day | ORAL | Status: DC
  Administered 2020-07-30 – 2020-08-02 (×4): 10 mg via ORAL
  Filled 2020-07-30 (×4): qty 1

## 2020-07-30 NOTE — Progress Notes (Signed)
[facility]   Reason for visit: follow up on bacteremia  Interval History: Haemophilus influenza and beta lactamase positive. WBC down to 15.6.  remains afebrile.  Overall feeling better and bedside nursing titrating oxygen down to 6 L.  She still experiences DOE but improved.  Day 5 ceftriaxone  Physical Exam: Constitutional:  Vitals:   07/30/20 0819 07/30/20 0857  BP:  (!) 171/67  Pulse:    Resp:    Temp:    SpO2: 95%   She is in nad, sitting up in bed Respiratory: normal respiratory effort, CTA B Cardiovascular: RRR  Review of Systems: Constitutional: negative for fevers and chills Gastrointestinal: negative for nausea and diarrhea  Lab Results  Component Value Date   WBC 15.6 (H) 07/30/2020   HGB 11.5 (L) 07/30/2020   HCT 35.1 (L) 07/30/2020   MCV 92.4 07/30/2020   PLT 356 07/30/2020    Lab Results  Component Value Date   CREATININE 0.52 07/30/2020   BUN 20 07/30/2020   NA 137 07/30/2020   K 4.2 07/30/2020   CL 94 (L) 07/30/2020   CO2 35 (H) 07/30/2020    Lab Results  Component Value Date   ALT 29 07/28/2020   AST 20 07/28/2020   ALKPHOS 81 07/28/2020     Microbiology: Recent Results (from the past 240 hour(s))  Resp Panel by RT-PCR (Flu A&B, Covid) Nasopharyngeal Swab     Status: None   Collection Time: 07/26/20 10:16 AM   Specimen: Nasopharyngeal Swab; Nasopharyngeal(NP) swabs in vial transport medium  Result Value Ref Range Status   SARS Coronavirus 2 by RT PCR NEGATIVE NEGATIVE Final    Comment: (NOTE) SARS-CoV-2 target nucleic acids are NOT DETECTED.  The SARS-CoV-2 RNA is generally detectable in upper respiratory specimens during the acute phase of infection. The lowest concentration of SARS-CoV-2 viral copies this assay can detect is 138 copies/mL. A negative result does not preclude SARS-Cov-2 infection and should not be used as the sole basis for treatment or other patient management decisions. A negative  result may occur with  improper specimen collection/handling, submission of specimen other than nasopharyngeal swab, presence of viral mutation(s) within the areas targeted by this assay, and inadequate number of viral copies(<138 copies/mL). A negative result must be combined with clinical observations, patient history, and epidemiological information. The expected result is Negative.  Fact Sheet for Patients:  BloggerCourse.com  Fact Sheet for Healthcare Providers:  SeriousBroker.it  This test is no t yet approved or cleared by the [place] FDA and  has been authorized for detection and/or diagnosis of SARS-CoV-2 by FDA under an Emergency Use Authorization (EUA). This EUA will remain  in effect (meaning this test can be used) for the duration of the COVID-19 declaration under Section 564(b)(1) of the Act, 21 U.S.C.section 360bbb-3(b)(1), unless the authorization is terminated  or revoked sooner.       Influenza A by PCR NEGATIVE NEGATIVE Final   Influenza B by PCR NEGATIVE NEGATIVE Final    Comment: (NOTE) The Xpert Xpress SARS-CoV-2/FLU/RSV plus assay is intended as an aid in the diagnosis of influenza from Nasopharyngeal swab specimens and should not be used as a sole basis for treatment. Nasal washings and aspirates are unacceptable for Xpert Xpress SARS-CoV-2/FLU/RSV testing.  Fact Sheet for Patients: BloggerCourse.com  Fact Sheet for Healthcare Providers: SeriousBroker.it  This test is not yet approved or cleared by the [place] FDA and has been authorized for detection and/or diagnosis of SARS-CoV-2  by FDA under an Emergency Use Authorization (EUA). This EUA will remain in effect (meaning this test can be used) for the duration of the COVID-19 declaration under Section 564(b)(1) of the Act, 21 U.S.C. section 360bbb-3(b)(1), unless the authorization is  terminated or revoked.  Performed at [facility], [address] [place], [place] [address]   Blood Culture (routine x 2)     Status: Abnormal (Preliminary result)   Collection Time: 07/26/20 11:13 AM   Specimen: BLOOD  Result Value Ref Range Status   Specimen Description BLOOD LEFT ANTECUBITAL  Final   Special Requests   Final    BOTTLES DRAWN AEROBIC AND ANAEROBIC Blood Culture results may not be optimal due to an excessive volume of blood received in culture bottles   Culture  Setup Time   Final    GRAM NEGATIVE RODS AEROBIC BOTTLE ONLY CRITICAL VALUE NOTED.  VALUE IS CONSISTENT WITH PREVIOUSLY REPORTED AND CALLED VALUE.    Culture (A)  Final    HAEMOPHILUS INFLUENZAE BETA LACTAMASE POSITIVE Performed at [facility], [address] [place], [place] [address]    Report Status PENDING  Incomplete  Blood Culture (routine x 2)     Status: Abnormal (Preliminary result)   Collection Time: 07/26/20 11:13 AM   Specimen: BLOOD LEFT HAND  Result Value Ref Range Status   Specimen Description BLOOD LEFT HAND  Final   Special Requests   Final    BOTTLES DRAWN AEROBIC AND ANAEROBIC Blood Culture adequate volume   Culture  Setup Time   Final    GRAM NEGATIVE RODS AEROBIC BOTTLE ONLY CRITICAL RESULT CALLED TO, READ BACK BY AND VERIFIED WITHMelven Sartorius PHARMD, AT 0224 07/28/20 DV    Culture (A)  Final    HAEMOPHILUS INFLUENZAE BETA LACTAMASE POSITIVE HEALTH DEPARTMENT NOTIFIED Performed at [facility], [address] [place], [place] [address]    Report Status PENDING  Incomplete  Blood Culture ID Panel (Reflexed)     Status: Abnormal   Collection Time: 07/26/20 11:13 AM  Result Value Ref Range Status   Enterococcus faecalis NOT DETECTED NOT DETECTED Final   Enterococcus Faecium NOT DETECTED NOT DETECTED Final   Listeria monocytogenes NOT DETECTED NOT DETECTED Final   Staphylococcus species NOT DETECTED NOT DETECTED Final   Staphylococcus aureus (BCID) NOT  DETECTED NOT DETECTED Final   Staphylococcus epidermidis NOT DETECTED NOT DETECTED Final   Staphylococcus lugdunensis NOT DETECTED NOT DETECTED Final   Streptococcus species NOT DETECTED NOT DETECTED Final   Streptococcus agalactiae NOT DETECTED NOT DETECTED Final   Streptococcus pneumoniae NOT DETECTED NOT DETECTED Final   Streptococcus pyogenes NOT DETECTED NOT DETECTED Final   A.calcoaceticus-baumannii NOT DETECTED NOT DETECTED Final   Bacteroides fragilis NOT DETECTED NOT DETECTED Final   Enterobacterales NOT DETECTED NOT DETECTED Final   Enterobacter cloacae complex NOT DETECTED NOT DETECTED Final   Escherichia coli NOT DETECTED NOT DETECTED Final   Klebsiella aerogenes NOT DETECTED NOT DETECTED Final   Klebsiella oxytoca NOT DETECTED NOT DETECTED Final   Klebsiella pneumoniae NOT DETECTED NOT DETECTED Final   Proteus species NOT DETECTED NOT DETECTED Final   Salmonella species NOT DETECTED NOT DETECTED Final   Serratia marcescens NOT DETECTED NOT DETECTED Final   Haemophilus influenzae DETECTED (A) NOT DETECTED Final    Comment: CRITICAL RESULT CALLED TO, READ BACK BY AND VERIFIED WITH: J. LEDFORD PHARMD, AT 0224 07/28/20 DV    Neisseria meningitidis NOT DETECTED NOT DETECTED Final  Pseudomonas aeruginosa NOT DETECTED NOT DETECTED Final   Stenotrophomonas maltophilia NOT DETECTED NOT DETECTED Final   Candida albicans NOT DETECTED NOT DETECTED Final   Candida auris NOT DETECTED NOT DETECTED Final   Candida glabrata NOT DETECTED NOT DETECTED Final   Candida krusei NOT DETECTED NOT DETECTED Final   Candida parapsilosis NOT DETECTED NOT DETECTED Final   Candida tropicalis NOT DETECTED NOT DETECTED Final   Cryptococcus neoformans/gattii NOT DETECTED NOT DETECTED Final    Comment: Performed at [facility], [address], [place], [place] [address]    Impression/Plan:  1. H influenza bacteremia - with RLL opacity and hypoxia c/w pneumonia.  Beta lactamase positive so will  continue with ceftriaxone for 7 days total.  Stop date placed.  Continue supportive care otherwise.   2.  Leukocytosis - infection and steroid use.  Trending down.    3. Hypoxia - slow improvement from pneumonia.  She likely has some element of underlying lung disease with her long smoking history.   Wean as tolerated.  I will sign off, call with questions.

## 2020-07-30 NOTE — Progress Notes (Signed)
SATURATION QUALIFICATIONS: (This note is used to comply with regulatory documentation for home oxygen)  Patient Saturations on Room Air at Rest = 91%  Patient Saturations on Room Air while Ambulating = 84%  Patient Saturations on 6 Liters of oxygen while Ambulating = 93%  Please briefly explain why patient needs home oxygen:Pt needs 6LO2 to maintain sats >90%.   Cruz Devilla M,PT Acute Rehab Services [phone] (pager)

## 2020-07-30 NOTE — Progress Notes (Signed)
RT NOTES: Unable to perform peak flow effectively due to dyspnea pre and post neb treatment.

## 2020-07-30 NOTE — Plan of Care (Signed)

## 2020-07-30 NOTE — Progress Notes (Signed)
Unable to perform peak flow effectively due to dyspnea pre and post neb tx.

## 2020-07-30 NOTE — Progress Notes (Signed)
Physical Therapy Treatment Patient Details Name: Lisa Owen MRN: [id number] DOB: 07/05/1950 Today's Date: 07/30/2020    History of Present Illness 70 y.o. female presenting to ED on 6/20 with worsening SOB, productive cough and wheezing. SpO2 in 70's when EMS arrived. Patient admitted with PNA, acute hypoxic respiratory failure and acute asthma exacerbation. PMHx significant for mild persistent asthma, HTN and osteopenia.    PT Comments    Pt admitted with above diagnosis. Pt was able to ambulate without device but with one slight LOB which pt self corrected. Pt with poor endurance and is fatigued after max of 40 feet of ambulation. Needs 6LO2 to keep sats >90% and no c/o dizziness. Will continue to follow pt for therapy.   Pt currently with functional limitations due to balance and endurance deficits. Pt will benefit from skilled PT to increase their independence and safety with mobility to allow discharge to the venue listed below.     SATURATION QUALIFICATIONS: (This note is used to comply with regulatory documentation for home oxygen)  Patient Saturations on Room Air at Rest = 91%  Patient Saturations on Room Air while Ambulating = 84%  Patient Saturations on 6 Liters of oxygen while Ambulating = 93%  Please briefly explain why patient needs home oxygen:Pt needs 6LO2 to maintain sats >90%.   Follow Up Recommendations  Home health PT;Supervision - Intermittent     Equipment Recommendations  Other (comment) (rollator)    Recommendations for Other Services       Precautions / Restrictions Precautions Precautions: Other (comment) Precaution Comments: Monitor vitals Restrictions Weight Bearing Restrictions: No    Mobility  Bed Mobility               General bed mobility comments: in chair on arrival    Transfers Overall transfer level: Needs assistance Equipment used: None Transfers: Sit to/from Stand Sit to Stand: Supervision             Ambulation/Gait Ambulation/Gait assistance: Supervision;Min guard Gait Distance (Feet): 40 Feet (20 feet, 20 feet, 20 feet then 40 feet) Assistive device: None Gait Pattern/deviations: Decreased stride length;Step-through pattern Gait velocity: decreased Gait velocity interpretation: <1.31 ft/sec, indicative of household ambulator General Gait Details: pt did not want to walk into hall due to need to wear a mask, therefore laps done in room.  Needed 6LO2 to maintain sats >90% and for DOE 1/4.Pt 3/4 DOE with 4LO2 and pt c/o dizziness.   Stairs             Wheelchair Mobility    Modified Rankin (Stroke Patients Only)       Balance   Sitting-balance support: No upper extremity supported Sitting balance-Leahy Scale: Good     Standing balance support: No upper extremity supported Standing balance-Leahy Scale: Fair                              Cognition Arousal/Alertness: Awake/alert Behavior During Therapy: WFL for tasks assessed/performed Overall Cognitive Status: Within Functional Limits for tasks assessed                                        Exercises General Exercises - Lower Extremity Long Arc Quad: AROM;Both;10 reps;Seated Hip Flexion/Marching: AROM;Both;10 reps;Seated    General Comments        Pertinent Vitals/Pain Pain Assessment: No/denies pain  Home Living                      Prior Function            PT Goals (current goals can now be found in the care plan section) Acute Rehab PT Goals Patient Stated Goal: To feel better. Progress towards PT goals: Progressing toward goals    Frequency    Min 3X/week      PT Plan Current plan remains appropriate    Co-evaluation              AM-PAC PT "6 Clicks" Mobility   Outcome Measure  Help needed turning from your back to your side while in a flat bed without using bedrails?: A Little Help needed moving from lying on your back to sitting  on the side of a flat bed without using bedrails?: A Little Help needed moving to and from a bed to a chair (including a wheelchair)?: A Little Help needed standing up from a chair using your arms (e.g., wheelchair or bedside chair)?: A Little Help needed to walk in hospital room?: A Little Help needed climbing 3-5 steps with a railing? : A Little 6 Click Score: 18    End of Session Equipment Utilized During Treatment: Oxygen;Gait belt Activity Tolerance: Patient tolerated treatment well;Patient limited by fatigue Patient left: in chair;with call bell/phone within reach Nurse Communication: Mobility status PT Visit Diagnosis: Other abnormalities of gait and mobility (R26.89)     Time: 3546-5681 PT Time Calculation (min) (ACUTE ONLY): 23 min  Charges:  $Gait Training: 23-37 mins                     Lisa Owen,PT Acute Rehab Services [phone] (pager)    Lisa Owen 07/30/2020, 3:57 PM

## 2020-07-30 NOTE — Progress Notes (Addendum)
PROGRESS NOTE    Lisa Owen  WUJ:[id number] DOB: 12/27/1950 DOA: 07/26/2020 PCP: Blair HeysEhinger, Robert, MD    Brief Narrative:  70 y.o. female with medical history significant of mild persistent asthma, HTN, osteopenia, presented with asthma-like symptoms.  Assessment & Plan:   Active Problems:   Hypoxia   Pneumonia   PNA (pneumonia)  Acute hypoxic respiratory failure 2/2 Right LL PNA --started on ceftriaxone and Zithromax on admission, azithro since d/c'ed by ID. --O2 requirement increased to 7L on 6/23 Plan: --cont ceftriaxone, for 7-day treatment --Continue supplemental O2 to keep sats >=92%, wean as tolerated  Sepsis, POA --tachycardia, leukocytosis on presentation, with source of infection from blood and lungs.  H influenza bacteremia --ID auto-consulted --cont ceftriaxone --no need for repeat blood cx, per ID   Suspected Acute asthma exacerbation vs undiagnosed COPD exacerbation 2nd hand smoke --cont DuoNeb and inhaler --cont prednisone 40 mg daily --consider inpatient pulm consult if O2 requirement does not improve  Acute hypercapnic respiratory failure -Pt noted to be hypercarbic, requiring bipap support --BiPAP nightly and PRN   Hyponatremia -HCTZ was changed to amlodipine at time of presentation   Hypertension -HCTZ was changed to amlodipine at time of presentation --BP better controlled after increasing BP meds. Plan: --cont amlodipine (new) and Lisinopril (new)  Anxiety --cont home Lexapro    DVT prophylaxis: Lovenox subq Code Status: Full Family Communication: husband updated at bedside today Status is: Inpatient  Remains inpatient appropriate because:Inpatient level of care appropriate due to severity of illness  Dispo: The patient is from: Home              Anticipated d/c is to: Home              Patient currently is not medically stable to d/c.  On IV abx for bacteremia.  7L O2 new, not improving yet.    Difficult to place patient No     Consultants:    Procedures:    Antimicrobials: Anti-infectives (From admission, onward)    Start     Dose/Rate Route Frequency Ordered Stop   07/26/20 1030  cefTRIAXone (ROCEPHIN) 2 g in sodium chloride 0.9 % 100 mL IVPB        2 g 200 mL/hr over 30 Minutes Intravenous Every 24 hours 07/26/20 1016 08/01/20 2359   07/26/20 1030  azithromycin (ZITHROMAX) 500 mg in sodium chloride 0.9 % 250 mL IVPB  Status:  Discontinued        500 mg 250 mL/hr over 60 Minutes Intravenous Every 24 hours 07/26/20 1016 07/28/20 0936       Subjective: Complained of feeling lightheaded today.  Also somewhat anxious.  Felt like there is sputum stuck on the left side of the base of her neck.      Objective: Vitals:   07/30/20 0819 07/30/20 0857 07/30/20 1319 07/30/20 1325  BP:  (!) 171/67 (!) 127/54 (!) 143/59  Pulse:   72 73  Resp:   18   Temp:   98.7 F (37.1 C)   TempSrc:      SpO2: 95%  96%   Weight:      Height:        Intake/Output Summary (Last 24 hours) at 07/30/2020 1444 Last data filed at 07/30/2020 0626 Gross per 24 hour  Intake 400 ml  Output --  Net 400 ml   Filed Weights   07/26/20 1024  Weight: 71.2 kg    Examination: Constitutional: NAD, AAOx3 HEENT: conjunctivae and lids normal, EOMI  CV: No cyanosis.   RESP: rhonchi, gurgling, on 6L Extremities: No effusions, edema in BLE SKIN: warm, dry Neuro: II - XII grossly intact.   Psych: Normal mood and affect.  Appropriate judgement and reason   Data Reviewed: I have personally reviewed following labs and imaging studies  CBC: Recent Labs  Lab 07/26/20 1058 07/26/20 1127 07/27/20 0102 07/28/20 0114 07/29/20 0159 07/30/20 0102  WBC 22.8*  --  17.6* 19.4* 20.2* 15.6*  NEUTROABS 18.9*  --   --   --   --   --   HGB 13.0 14.3 11.7* 11.2* 11.1* 11.5*  HCT 38.3 42.0 34.7* 32.6* 33.4* 35.1*  MCV 91.6  --  92.3 91.8 92.0 92.4  PLT 373  --  317 347 356 356   Basic Metabolic Panel: Recent Labs  Lab 07/26/20 1058  07/26/20 1127 07/27/20 0102 07/28/20 0114 07/29/20 0159 07/30/20 0102  NA 129* 128* 130* 131* 135 137  K 3.5 3.5 3.7 3.8 3.8 4.2  CL 91* 94* 92* 93* 94* 94*  CO2 27  --  29 30 35* 35*  GLUCOSE 139* 141* 143* 137* 110* 105*  BUN 23 31* 29* 30* 18 20  CREATININE 0.82 0.80 0.98 0.78 0.62 0.52  CALCIUM 9.1  --  9.0 9.1 8.9 9.0  MG  --   --   --   --  2.2 2.2   GFR: Estimated Creatinine Clearance: 66.6 mL/min (by C-G formula based on SCr of 0.52 mg/dL). Liver Function Tests: Recent Labs  Lab 07/26/20 1058 07/28/20 0114  AST 26 20  ALT 29 29  ALKPHOS 98 81  BILITOT 0.8 0.6  PROT 7.2 6.3*  ALBUMIN 2.9* 2.5*   No results for input(s): LIPASE, AMYLASE in the last 168 hours. No results for input(s): AMMONIA in the last 168 hours. Coagulation Profile: Recent Labs  Lab 07/26/20 1058  INR 1.1   Cardiac Enzymes: No results for input(s): CKTOTAL, CKMB, CKMBINDEX, TROPONINI in the last 168 hours. BNP (last 3 results) No results for input(s): PROBNP in the last 8760 hours. HbA1C: No results for input(s): HGBA1C in the last 72 hours. CBG: No results for input(s): GLUCAP in the last 168 hours. Lipid Profile: No results for input(s): CHOL, HDL, LDLCALC, TRIG, CHOLHDL, LDLDIRECT in the last 72 hours. Thyroid Function Tests: No results for input(s): TSH, T4TOTAL, FREET4, T3FREE, THYROIDAB in the last 72 hours. Anemia Panel: No results for input(s): VITAMINB12, FOLATE, FERRITIN, TIBC, IRON, RETICCTPCT in the last 72 hours. Sepsis Labs: Recent Labs  Lab 07/26/20 1058 07/26/20 1113 07/29/20 0159  PROCALCITON  --   --  0.29  LATICACIDVEN 1.3 1.4  --     Recent Results (from the past 240 hour(s))  Resp Panel by RT-PCR (Flu A&B, Covid) Nasopharyngeal Swab     Status: None   Collection Time: 07/26/20 10:16 AM   Specimen: Nasopharyngeal Swab; Nasopharyngeal(NP) swabs in vial transport medium  Result Value Ref Range Status   SARS Coronavirus 2 by RT PCR NEGATIVE NEGATIVE Final     Comment: (NOTE) SARS-CoV-2 target nucleic acids are NOT DETECTED.  The SARS-CoV-2 RNA is generally detectable in upper respiratory specimens during the acute phase of infection. The lowest concentration of SARS-CoV-2 viral copies this assay can detect is 138 copies/mL. A negative result does not preclude SARS-Cov-2 infection and should not be used as the sole basis for treatment or other patient management decisions. A negative result may occur with  improper specimen collection/handling, submission of specimen other than  nasopharyngeal swab, presence of viral mutation(s) within the areas targeted by this assay, and inadequate number of viral copies(<138 copies/mL). A negative result must be combined with clinical observations, patient history, and epidemiological information. The expected result is Negative.  Fact Sheet for Patients:  BloggerCourse.com  Fact Sheet for Healthcare Providers:  SeriousBroker.it  This test is no t yet approved or cleared by the [place] FDA and  has been authorized for detection and/or diagnosis of SARS-CoV-2 by FDA under an Emergency Use Authorization (EUA). This EUA will remain  in effect (meaning this test can be used) for the duration of the COVID-19 declaration under Section 564(b)(1) of the Act, 21 U.S.C.section 360bbb-3(b)(1), unless the authorization is terminated  or revoked sooner.       Influenza A by PCR NEGATIVE NEGATIVE Final   Influenza B by PCR NEGATIVE NEGATIVE Final    Comment: (NOTE) The Xpert Xpress SARS-CoV-2/FLU/RSV plus assay is intended as an aid in the diagnosis of influenza from Nasopharyngeal swab specimens and should not be used as a sole basis for treatment. Nasal washings and aspirates are unacceptable for Xpert Xpress SARS-CoV-2/FLU/RSV testing.  Fact Sheet for Patients: BloggerCourse.com  Fact Sheet for Healthcare  Providers: SeriousBroker.it  This test is not yet approved or cleared by the [place] FDA and has been authorized for detection and/or diagnosis of SARS-CoV-2 by FDA under an Emergency Use Authorization (EUA). This EUA will remain in effect (meaning this test can be used) for the duration of the COVID-19 declaration under Section 564(b)(1) of the Act, 21 U.S.C. section 360bbb-3(b)(1), unless the authorization is terminated or revoked.  Performed at [facility], [address], [place], [place] [address]   Blood Culture (routine x 2)     Status: Abnormal (Preliminary result)   Collection Time: 07/26/20 11:13 AM   Specimen: BLOOD  Result Value Ref Range Status   Specimen Description BLOOD LEFT ANTECUBITAL  Final   Special Requests   Final    BOTTLES DRAWN AEROBIC AND ANAEROBIC Blood Culture results may not be optimal due to an excessive volume of blood received in culture bottles   Culture  Setup Time   Final    GRAM NEGATIVE RODS AEROBIC BOTTLE ONLY CRITICAL VALUE NOTED.  VALUE IS CONSISTENT WITH PREVIOUSLY REPORTED AND CALLED VALUE.    Culture (A)  Final    HAEMOPHILUS INFLUENZAE BETA LACTAMASE POSITIVE Performed at [facility], [address], [place], [place] [address]    Report Status PENDING  Incomplete  Blood Culture (routine x 2)     Status: Abnormal (Preliminary result)   Collection Time: 07/26/20 11:13 AM   Specimen: BLOOD LEFT HAND  Result Value Ref Range Status   Specimen Description BLOOD LEFT HAND  Final   Special Requests   Final    BOTTLES DRAWN AEROBIC AND ANAEROBIC Blood Culture adequate volume   Culture  Setup Time   Final    GRAM NEGATIVE RODS AEROBIC BOTTLE ONLY CRITICAL RESULT CALLED TO, READ BACK BY AND VERIFIED WITHMelven Sartorius PHARMD, AT 0224 07/28/20 DV    Culture (A)  Final    HAEMOPHILUS INFLUENZAE BETA LACTAMASE POSITIVE HEALTH DEPARTMENT NOTIFIED Performed at [facility], [address], [place], [place] [address]    Report Status PENDING  Incomplete  Blood Culture ID Panel (Reflexed)     Status: Abnormal   Collection Time: 07/26/20 11:13 AM  Result Value Ref Range Status   Enterococcus faecalis NOT DETECTED NOT  DETECTED Final   Enterococcus Faecium NOT DETECTED NOT DETECTED Final   Listeria monocytogenes NOT DETECTED NOT DETECTED Final   Staphylococcus species NOT DETECTED NOT DETECTED Final   Staphylococcus aureus (BCID) NOT DETECTED NOT DETECTED Final   Staphylococcus epidermidis NOT DETECTED NOT DETECTED Final   Staphylococcus lugdunensis NOT DETECTED NOT DETECTED Final   Streptococcus species NOT DETECTED NOT DETECTED Final   Streptococcus agalactiae NOT DETECTED NOT DETECTED Final   Streptococcus pneumoniae NOT DETECTED NOT DETECTED Final   Streptococcus pyogenes NOT DETECTED NOT DETECTED Final   A.calcoaceticus-baumannii NOT DETECTED NOT DETECTED Final   Bacteroides fragilis NOT DETECTED NOT DETECTED Final   Enterobacterales NOT DETECTED NOT DETECTED Final   Enterobacter cloacae complex NOT DETECTED NOT DETECTED Final   Escherichia coli NOT DETECTED NOT DETECTED Final   Klebsiella aerogenes NOT DETECTED NOT DETECTED Final   Klebsiella oxytoca NOT DETECTED NOT DETECTED Final   Klebsiella pneumoniae NOT DETECTED NOT DETECTED Final   Proteus species NOT DETECTED NOT DETECTED Final   Salmonella species NOT DETECTED NOT DETECTED Final   Serratia marcescens NOT DETECTED NOT DETECTED Final   Haemophilus influenzae DETECTED (A) NOT DETECTED Final    Comment: CRITICAL RESULT CALLED TO, READ BACK BY AND VERIFIED WITH: J. LEDFORD PHARMD, AT 0224 07/28/20 DV    Neisseria meningitidis NOT DETECTED NOT DETECTED Final   Pseudomonas aeruginosa NOT DETECTED NOT DETECTED Final   Stenotrophomonas maltophilia NOT DETECTED NOT DETECTED Final   Candida albicans NOT DETECTED NOT DETECTED Final   Candida auris NOT DETECTED NOT DETECTED Final   Candida glabrata NOT DETECTED NOT  DETECTED Final   Candida krusei NOT DETECTED NOT DETECTED Final   Candida parapsilosis NOT DETECTED NOT DETECTED Final   Candida tropicalis NOT DETECTED NOT DETECTED Final   Cryptococcus neoformans/gattii NOT DETECTED NOT DETECTED Final    Comment: Performed at [facility], [address], [place], [place] [address]     Radiology Studies: No results found.  Scheduled Meds:  amLODipine  10 mg Oral Daily   aspirin EC  81 mg Oral Daily   cholecalciferol  1,000 Units Oral Daily   enoxaparin (LOVENOX) injection  40 mg Subcutaneous Daily   escitalopram  20 mg Oral Daily   fluticasone furoate-vilanterol  1 puff Inhalation Daily   folic acid  0.5 mg Oral Daily   guaiFENesin  1,200 mg Oral BID   ipratropium-albuterol  3 mL Nebulization BID   lactose free nutrition  237 mL Oral Q24H   lisinopril  10 mg Oral Daily   multivitamin with minerals  1 tablet Oral Daily   predniSONE  40 mg Oral Q breakfast   Continuous Infusions:  cefTRIAXone (ROCEPHIN)  IV 2 g (07/30/20 0905)     LOS: 4 days   Darlin Priestly, MD [facility] Pager On Amion  If 7PM-7AM, please contact night-coverage 07/30/2020, 2:44 PM

## 2020-07-31 LAB — CBC
HCT: 34.1 % — ABNORMAL LOW (ref 36.0–46.0)
Hemoglobin: 11.4 g/dL — ABNORMAL LOW (ref 12.0–15.0)
MCH: 30.8 pg (ref 26.0–34.0)
MCHC: 33.4 g/dL (ref 30.0–36.0)
MCV: 92.2 fL (ref 80.0–100.0)
Platelets: 348 10*3/uL (ref 150–400)
RBC: 3.7 MIL/uL — ABNORMAL LOW (ref 3.87–5.11)
RDW: 14.6 % (ref 11.5–15.5)
WBC: 16 10*3/uL — ABNORMAL HIGH (ref 4.0–10.5)
nRBC: 0 % (ref 0.0–0.2)

## 2020-07-31 LAB — BASIC METABOLIC PANEL
Anion gap: 4 — ABNORMAL LOW (ref 5–15)
BUN: 14 mg/dL (ref 8–23)
CO2: 37 mmol/L — ABNORMAL HIGH (ref 22–32)
Calcium: 8.7 mg/dL — ABNORMAL LOW (ref 8.9–10.3)
Chloride: 96 mmol/L — ABNORMAL LOW (ref 98–111)
Creatinine, Ser: 0.66 mg/dL (ref 0.44–1.00)
GFR, Estimated: >60 mL/min (ref >60)
Glucose, Bld: 102 mg/dL — ABNORMAL HIGH (ref 70–99)
Potassium: 5.1 mmol/L (ref 3.5–5.1)
Sodium: 137 mmol/L (ref 135–145)

## 2020-07-31 LAB — MAGNESIUM: Magnesium: 2.1 mg/dL (ref 1.7–2.4)

## 2020-07-31 MED ORDER — DIPHENHYDRAMINE HCL 25 MG PO CAPS
25.0000 mg | ORAL_CAPSULE | Freq: Three times a day (TID) | ORAL | Status: DC | PRN
  Administered 2020-07-31 – 2020-08-03 (×5): 25 mg via ORAL
  Filled 2020-07-31 (×5): qty 1

## 2020-07-31 NOTE — Progress Notes (Signed)
PROGRESS NOTE    Lisa FrederickSybil B Owen  ZOX:[id number] DOB: 08/28/1950 DOA: 07/26/2020 PCP: Blair HeysEhinger, Robert, MD    Brief Narrative:  70 y.o. female with medical history significant of mild persistent asthma, HTN, osteopenia, presented with asthma-like symptoms.  Assessment & Plan:   Active Problems:   Hypoxia   Pneumonia   PNA (pneumonia)  Acute hypoxic respiratory failure 2/2 Right LL PNA --started on ceftriaxone and Zithromax on admission, azithro since d/c'ed by ID. --O2 requirement increased to 7L on 6/23, slow to wean Plan: --cont ceftriaxone --Continue supplemental O2 to keep sats >=92%, wean as tolerated  Sepsis, POA --tachycardia, leukocytosis on presentation, with source of infection from blood and lungs. --treat infection as above  H influenza bacteremia --ID auto-consulted --cont ceftriaxone for 7-day tx --no need for repeat blood cx, per ID   Suspected Acute asthma exacerbation vs undiagnosed COPD exacerbation 2nd hand smoke --completed steroid burst --cont DuoNeb and inhaler --cont chest PT and mucus clearing  Acute hypercapnic respiratory failure -Pt noted to be hypercarbic, requiring bipap support --BiPAP nightly and PRN   Hyponatremia -HCTZ was changed to amlodipine at time of presentation   Hypertension -HCTZ was changed to amlodipine at time of presentation --BP better controlled after increasing BP meds. Plan: --cont amlodipine and Lisinopril (new)  Anxiety --cont home Lexapro    DVT prophylaxis: Lovenox subq Code Status: Full Family Communication:  Status is: Inpatient  Remains inpatient appropriate because:Inpatient level of care appropriate due to severity of illness  Dispo: The patient is from: Home              Anticipated d/c is to: Home              Patient currently is not medically stable to d/c.  On IV abx for bacteremia.  6L O2 new, very slow to improve    Difficult to place patient No    Consultants:    Procedures:     Antimicrobials: Anti-infectives (From admission, onward)    Start     Dose/Rate Route Frequency Ordered Stop   07/26/20 1030  cefTRIAXone (ROCEPHIN) 2 g in sodium chloride 0.9 % 100 mL IVPB        2 g 200 mL/hr over 30 Minutes Intravenous Every 24 hours 07/26/20 1016 08/01/20 2359   07/26/20 1030  azithromycin (ZITHROMAX) 500 mg in sodium chloride 0.9 % 250 mL IVPB  Status:  Discontinued        500 mg 250 mL/hr over 60 Minutes Intravenous Every 24 hours 07/26/20 1016 07/28/20 0936       Subjective: Still coughing up with sputum.  Desat easily with exertion, needing up to 6L.  Felt congested nasally.   Objective: Vitals:   07/31/20 0815 07/31/20 0911 07/31/20 1026 07/31/20 1344  BP:  (!) 155/48  (!) 141/65  Pulse:    73  Resp:    16  Temp:      TempSrc:      SpO2: 97%  96% 95%  Weight:      Height:       No intake or output data in the 24 hours ending 07/31/20 1530  Filed Weights   07/26/20 1024  Weight: 71.2 kg    Examination: Constitutional: NAD, AAOx3 HEENT: conjunctivae and lids normal, EOMI CV: No cyanosis.   RESP: normal respiratory effort, on 6L Extremities: No effusions, edema in BLE SKIN: warm, dry Neuro: II - XII grossly intact.   Psych: Normal mood and affect.  Appropriate judgement and  reason    Data Reviewed: I have personally reviewed following labs and imaging studies  CBC: Recent Labs  Lab 07/26/20 1058 07/26/20 1127 07/27/20 0102 07/28/20 0114 07/29/20 0159 07/30/20 0102 07/31/20 0319  WBC 22.8*  --  17.6* 19.4* 20.2* 15.6* 16.0*  NEUTROABS 18.9*  --   --   --   --   --   --   HGB 13.0   < > 11.7* 11.2* 11.1* 11.5* 11.4*  HCT 38.3   < > 34.7* 32.6* 33.4* 35.1* 34.1*  MCV 91.6  --  92.3 91.8 92.0 92.4 92.2  PLT 373  --  317 347 356 356 348   < > = values in this interval not displayed.   Basic Metabolic Panel: Recent Labs  Lab 07/27/20 0102 07/28/20 0114 07/29/20 0159 07/30/20 0102 07/31/20 0319  NA 130* 131* 135 137  137  K 3.7 3.8 3.8 4.2 5.1  CL 92* 93* 94* 94* 96*  CO2 29 30 35* 35* 37*  GLUCOSE 143* 137* 110* 105* 102*  BUN 29* 30* 18 20 14   CREATININE 0.98 0.78 0.62 0.52 0.66  CALCIUM 9.0 9.1 8.9 9.0 8.7*  MG  --   --  2.2 2.2 2.1   GFR: Estimated Creatinine Clearance: 66.6 mL/min (by C-G formula based on SCr of 0.66 mg/dL). Liver Function Tests: Recent Labs  Lab 07/26/20 1058 07/28/20 0114  AST 26 20  ALT 29 29  ALKPHOS 98 81  BILITOT 0.8 0.6  PROT 7.2 6.3*  ALBUMIN 2.9* 2.5*   No results for input(s): LIPASE, AMYLASE in the last 168 hours. No results for input(s): AMMONIA in the last 168 hours. Coagulation Profile: Recent Labs  Lab 07/26/20 1058  INR 1.1   Cardiac Enzymes: No results for input(s): CKTOTAL, CKMB, CKMBINDEX, TROPONINI in the last 168 hours. BNP (last 3 results) No results for input(s): PROBNP in the last 8760 hours. HbA1C: No results for input(s): HGBA1C in the last 72 hours. CBG: No results for input(s): GLUCAP in the last 168 hours. Lipid Profile: No results for input(s): CHOL, HDL, LDLCALC, TRIG, CHOLHDL, LDLDIRECT in the last 72 hours. Thyroid Function Tests: No results for input(s): TSH, T4TOTAL, FREET4, T3FREE, THYROIDAB in the last 72 hours. Anemia Panel: No results for input(s): VITAMINB12, FOLATE, FERRITIN, TIBC, IRON, RETICCTPCT in the last 72 hours. Sepsis Labs: Recent Labs  Lab 07/26/20 1058 07/26/20 1113 07/29/20 0159  PROCALCITON  --   --  0.29  LATICACIDVEN 1.3 1.4  --     Recent Results (from the past 240 hour(s))  Resp Panel by RT-PCR (Flu A&B, Covid) Nasopharyngeal Swab     Status: None   Collection Time: 07/26/20 10:16 AM   Specimen: Nasopharyngeal Swab; Nasopharyngeal(NP) swabs in vial transport medium  Result Value Ref Range Status   SARS Coronavirus 2 by RT PCR NEGATIVE NEGATIVE Final    Comment: (NOTE) SARS-CoV-2 target nucleic acids are NOT DETECTED.  The SARS-CoV-2 RNA is generally detectable in upper  respiratory specimens during the acute phase of infection. The lowest concentration of SARS-CoV-2 viral copies this assay can detect is 138 copies/mL. A negative result does not preclude SARS-Cov-2 infection and should not be used as the sole basis for treatment or other patient management decisions. A negative result may occur with  improper specimen collection/handling, submission of specimen other than nasopharyngeal swab, presence of viral mutation(s) within the areas targeted by this assay, and inadequate number of viral copies(<138 copies/mL). A negative result must be combined with  clinical observations, patient history, and epidemiological information. The expected result is Negative.  Fact Sheet for Patients:  BloggerCourse.com  Fact Sheet for Healthcare Providers:  SeriousBroker.it  This test is no t yet approved or cleared by the [place] FDA and  has been authorized for detection and/or diagnosis of SARS-CoV-2 by FDA under an Emergency Use Authorization (EUA). This EUA will remain  in effect (meaning this test can be used) for the duration of the COVID-19 declaration under Section 564(b)(1) of the Act, 21 U.S.C.section 360bbb-3(b)(1), unless the authorization is terminated  or revoked sooner.       Influenza A by PCR NEGATIVE NEGATIVE Final   Influenza B by PCR NEGATIVE NEGATIVE Final    Comment: (NOTE) The Xpert Xpress SARS-CoV-2/FLU/RSV plus assay is intended as an aid in the diagnosis of influenza from Nasopharyngeal swab specimens and should not be used as a sole basis for treatment. Nasal washings and aspirates are unacceptable for Xpert Xpress SARS-CoV-2/FLU/RSV testing.  Fact Sheet for Patients: BloggerCourse.com  Fact Sheet for Healthcare Providers: SeriousBroker.it  This test is not yet approved or cleared by the [place] FDA and has been  authorized for detection and/or diagnosis of SARS-CoV-2 by FDA under an Emergency Use Authorization (EUA). This EUA will remain in effect (meaning this test can be used) for the duration of the COVID-19 declaration under Section 564(b)(1) of the Act, 21 U.S.C. section 360bbb-3(b)(1), unless the authorization is terminated or revoked.  Performed at [facility], [address] [place], [place] [address]   Blood Culture (routine x 2)     Status: Abnormal (Preliminary result)   Collection Time: 07/26/20 11:13 AM   Specimen: BLOOD  Result Value Ref Range Status   Specimen Description BLOOD LEFT ANTECUBITAL  Final   Special Requests   Final    BOTTLES DRAWN AEROBIC AND ANAEROBIC Blood Culture results may not be optimal due to an excessive volume of blood received in culture bottles   Culture  Setup Time   Final    GRAM NEGATIVE RODS AEROBIC BOTTLE ONLY CRITICAL VALUE NOTED.  VALUE IS CONSISTENT WITH PREVIOUSLY REPORTED AND CALLED VALUE.    Culture (A)  Final    HAEMOPHILUS INFLUENZAE BETA LACTAMASE POSITIVE Performed at [facility], [address] [place], [place] [address]    Report Status PENDING  Incomplete  Blood Culture (routine x 2)     Status: Abnormal (Preliminary result)   Collection Time: 07/26/20 11:13 AM   Specimen: BLOOD LEFT HAND  Result Value Ref Range Status   Specimen Description BLOOD LEFT HAND  Final   Special Requests   Final    BOTTLES DRAWN AEROBIC AND ANAEROBIC Blood Culture adequate volume   Culture  Setup Time   Final    GRAM NEGATIVE RODS AEROBIC BOTTLE ONLY CRITICAL RESULT CALLED TO, READ BACK BY AND VERIFIED WITHMelven Sartorius PHARMD, AT 0224 07/28/20 DV    Culture (A)  Final    HAEMOPHILUS INFLUENZAE BETA LACTAMASE POSITIVE HEALTH DEPARTMENT NOTIFIED Performed at [facility], [address], [place], [place] [address]    Report Status PENDING  Incomplete  Blood Culture ID Panel (Reflexed)     Status: Abnormal   Collection  Time: 07/26/20 11:13 AM  Result Value Ref Range Status   Enterococcus faecalis NOT DETECTED NOT DETECTED Final   Enterococcus Faecium NOT DETECTED NOT DETECTED Final   Listeria monocytogenes NOT DETECTED NOT DETECTED Final   Staphylococcus species NOT DETECTED NOT  DETECTED Final   Staphylococcus aureus (BCID) NOT DETECTED NOT DETECTED Final   Staphylococcus epidermidis NOT DETECTED NOT DETECTED Final   Staphylococcus lugdunensis NOT DETECTED NOT DETECTED Final   Streptococcus species NOT DETECTED NOT DETECTED Final   Streptococcus agalactiae NOT DETECTED NOT DETECTED Final   Streptococcus pneumoniae NOT DETECTED NOT DETECTED Final   Streptococcus pyogenes NOT DETECTED NOT DETECTED Final   A.calcoaceticus-baumannii NOT DETECTED NOT DETECTED Final   Bacteroides fragilis NOT DETECTED NOT DETECTED Final   Enterobacterales NOT DETECTED NOT DETECTED Final   Enterobacter cloacae complex NOT DETECTED NOT DETECTED Final   Escherichia coli NOT DETECTED NOT DETECTED Final   Klebsiella aerogenes NOT DETECTED NOT DETECTED Final   Klebsiella oxytoca NOT DETECTED NOT DETECTED Final   Klebsiella pneumoniae NOT DETECTED NOT DETECTED Final   Proteus species NOT DETECTED NOT DETECTED Final   Salmonella species NOT DETECTED NOT DETECTED Final   Serratia marcescens NOT DETECTED NOT DETECTED Final   Haemophilus influenzae DETECTED (A) NOT DETECTED Final    Comment: CRITICAL RESULT CALLED TO, READ BACK BY AND VERIFIED WITH: J. LEDFORD PHARMD, AT 0224 07/28/20 DV    Neisseria meningitidis NOT DETECTED NOT DETECTED Final   Pseudomonas aeruginosa NOT DETECTED NOT DETECTED Final   Stenotrophomonas maltophilia NOT DETECTED NOT DETECTED Final   Candida albicans NOT DETECTED NOT DETECTED Final   Candida auris NOT DETECTED NOT DETECTED Final   Candida glabrata NOT DETECTED NOT DETECTED Final   Candida krusei NOT DETECTED NOT DETECTED Final   Candida parapsilosis NOT DETECTED NOT DETECTED Final   Candida  tropicalis NOT DETECTED NOT DETECTED Final   Cryptococcus neoformans/gattii NOT DETECTED NOT DETECTED Final    Comment: Performed at [facility], [address] [place], [place] [address]     Radiology Studies: No results found.  Scheduled Meds:  amLODipine  10 mg Oral Daily   aspirin EC  81 mg Oral Daily   cholecalciferol  1,000 Units Oral Daily   enoxaparin (LOVENOX) injection  40 mg Subcutaneous Daily   escitalopram  20 mg Oral Daily   fluticasone furoate-vilanterol  1 puff Inhalation Daily   folic acid  0.5 mg Oral Daily   ipratropium-albuterol  3 mL Nebulization BID   lactose free nutrition  237 mL Oral Q24H   lisinopril  10 mg Oral Daily   multivitamin with minerals  1 tablet Oral Daily   Continuous Infusions:  cefTRIAXone (ROCEPHIN)  IV 2 g (07/31/20 0917)     LOS: 5 days   Darlin Priestly, MD [facility] Pager On Amion  If 7PM-7AM, please contact night-coverage 07/31/2020, 3:30 PM

## 2020-07-31 NOTE — Plan of Care (Signed)

## 2020-07-31 NOTE — Progress Notes (Signed)
Pt unable to perform peak flow due to coughing.

## 2020-07-31 NOTE — Progress Notes (Signed)
Pt. Unable to perform peak flow pre tx. Or post tx.

## 2020-08-01 LAB — CBC
HCT: 33 % — ABNORMAL LOW (ref 36.0–46.0)
Hemoglobin: 10.8 g/dL — ABNORMAL LOW (ref 12.0–15.0)
MCH: 30.3 pg (ref 26.0–34.0)
MCHC: 32.7 g/dL (ref 30.0–36.0)
MCV: 92.7 fL (ref 80.0–100.0)
Platelets: 347 10*3/uL (ref 150–400)
RBC: 3.56 MIL/uL — ABNORMAL LOW (ref 3.87–5.11)
RDW: 14.6 % (ref 11.5–15.5)
WBC: 14.3 10*3/uL — ABNORMAL HIGH (ref 4.0–10.5)
nRBC: 0 % (ref 0.0–0.2)

## 2020-08-01 LAB — BASIC METABOLIC PANEL
Anion gap: 7 (ref 5–15)
BUN: 14 mg/dL (ref 8–23)
CO2: 36 mmol/L — ABNORMAL HIGH (ref 22–32)
Calcium: 8.7 mg/dL — ABNORMAL LOW (ref 8.9–10.3)
Chloride: 93 mmol/L — ABNORMAL LOW (ref 98–111)
Creatinine, Ser: 0.57 mg/dL (ref 0.44–1.00)
GFR, Estimated: >60 mL/min (ref >60)
Glucose, Bld: 114 mg/dL — ABNORMAL HIGH (ref 70–99)
Potassium: 4.3 mmol/L (ref 3.5–5.1)
Sodium: 136 mmol/L (ref 135–145)

## 2020-08-01 LAB — MAGNESIUM: Magnesium: 2.1 mg/dL (ref 1.7–2.4)

## 2020-08-01 MED ORDER — NYSTATIN 100000 UNIT/ML MT SUSP
5.0000 mL | Freq: Four times a day (QID) | OROMUCOSAL | Status: DC
  Administered 2020-08-01 – 2020-08-03 (×7): 500000 [IU] via ORAL
  Filled 2020-08-01 (×5): qty 5

## 2020-08-01 NOTE — Progress Notes (Signed)
[facility] night shift MedSurg coverage note.  The nursing staff reported that the patient has a white coating in her mouth around the tongue and her inner cheeks.looks like oral thrush.  She is currently being treated for sepsis in the setting of RLL pneumonia.  Nystatin 500,000 units 4 times a day ordered.  Sanda Klein, MD.

## 2020-08-01 NOTE — Progress Notes (Signed)
RT note. Patient currently on 5L salter sat 99% w/ stable VS. No labored breathing noted. Patient refusing bipap at this time.  Bipap is in patients room if needed/anything changes.

## 2020-08-01 NOTE — Progress Notes (Signed)
PROGRESS NOTE    Lisa Owen  ZOX:[id number] DOB: [birth year] DOA: 07/26/2020 PCP: Blair Heys, MD    Brief Narrative:  70 y.o. female with medical history significant of mild persistent asthma, HTN, osteopenia, presented with asthma-like symptoms.  Assessment & Plan:   Active Problems:   Hypoxia   Pneumonia   PNA (pneumonia)  Acute hypoxic respiratory failure 2/2 Right LL PNA --started on ceftriaxone and Zithromax on admission, azithro since d/c'ed by ID. --O2 requirement increased to 7L on 6/23, slow to wean Plan: --cont ceftriaxone, total 7 days --Continue supplemental O2 to keep sats >=92%, wean as tolerated  Sepsis, POA --tachycardia, leukocytosis on presentation, with source of infection from blood and lungs. --treat infection as above  H influenza bacteremia --ID auto-consulted --cont ceftriaxone for 7 days tx --no need for repeat blood cx, per ID   Suspected Acute asthma exacerbation vs undiagnosed COPD exacerbation 2nd hand smoke --completed steroid burst --cont DuoNeb and inhaler --cont chest PT  Acute hypercapnic respiratory failure -Pt noted to be hypercarbic, requiring bipap support, since off of it   Hyponatremia, resolved -HCTZ was changed to amlodipine at time of presentation   Hypertension -HCTZ was changed to amlodipine at time of presentation --BP better controlled after increasing BP meds. Plan: --cont amlodipine and Lisinopril  Anxiety --cont home Lexapro    DVT prophylaxis: Lovenox subq Code Status: Full Family Communication:  Status is: Inpatient  Remains inpatient appropriate because:Inpatient level of care appropriate due to severity of illness  Dispo: The patient is from: Home              Anticipated d/c is to: Home              Patient currently is not medically stable to d/c.  On IV abx for bacteremia.  6L O2 new, very slow to improve    Difficult to place patient No    Consultants:    Procedures:     Antimicrobials: Anti-infectives (From admission, onward)    Start     Dose/Rate Route Frequency Ordered Stop   07/26/20 1030  cefTRIAXone (ROCEPHIN) 2 g in sodium chloride 0.9 % 100 mL IVPB        2 g 200 mL/hr over 30 Minutes Intravenous Every 24 hours 07/26/20 1016 08/01/20 2359   07/26/20 1030  azithromycin (ZITHROMAX) 500 mg in sodium chloride 0.9 % 250 mL IVPB  Status:  Discontinued        500 mg 250 mL/hr over 60 Minutes Intravenous Every 24 hours 07/26/20 1016 07/28/20 0936       Subjective: Pt reported chest less tight.  O2 desat easily with exertion.     Objective: Vitals:   08/01/20 0745 08/01/20 0818 08/01/20 1045 08/01/20 1349  BP:  (!) 172/60  (!) 134/56  Pulse:    77  Resp:    16  Temp:      TempSrc:      SpO2: 95% (!) 86% 93% 97%  Weight:      Height:        Intake/Output Summary (Last 24 hours) at 08/01/2020 1531 Last data filed at 07/31/2020 2100 Gross per 24 hour  Intake --  Output 1 ml  Net -1 ml    Filed Weights   07/26/20 1024  Weight: 71.2 kg    Examination: Constitutional: NAD, AAOx3 HEENT: conjunctivae and lids normal, EOMI CV: No cyanosis.   RESP: normal respiratory effort, on 5L Extremities: No effusions, edema in BLE SKIN: warm, dry  Neuro: II - XII grossly intact.   Psych: Normal mood and affect.  Appropriate judgement and reason   Data Reviewed: I have personally reviewed following labs and imaging studies  CBC: Recent Labs  Lab 07/26/20 1058 07/26/20 1127 07/28/20 0114 07/29/20 0159 07/30/20 0102 07/31/20 0319 08/01/20 0212  WBC 22.8*   < > 19.4* 20.2* 15.6* 16.0* 14.3*  NEUTROABS 18.9*  --   --   --   --   --   --   HGB 13.0   < > 11.2* 11.1* 11.5* 11.4* 10.8*  HCT 38.3   < > 32.6* 33.4* 35.1* 34.1* 33.0*  MCV 91.6   < > 91.8 92.0 92.4 92.2 92.7  PLT 373   < > 347 356 356 348 347   < > = values in this interval not displayed.   Basic Metabolic Panel: Recent Labs  Lab 07/28/20 0114 07/29/20 0159  07/30/20 0102 07/31/20 0319 08/01/20 0212  NA 131* 135 137 137 136  K 3.8 3.8 4.2 5.1 4.3  CL 93* 94* 94* 96* 93*  CO2 30 35* 35* 37* 36*  GLUCOSE 137* 110* 105* 102* 114*  BUN 30* 18 20 14 14   CREATININE 0.78 0.62 0.52 0.66 0.57  CALCIUM 9.1 8.9 9.0 8.7* 8.7*  MG  --  2.2 2.2 2.1 2.1   GFR: Estimated Creatinine Clearance: 66.6 mL/min (by C-G formula based on SCr of 0.57 mg/dL). Liver Function Tests: Recent Labs  Lab 07/26/20 1058 07/28/20 0114  AST 26 20  ALT 29 29  ALKPHOS 98 81  BILITOT 0.8 0.6  PROT 7.2 6.3*  ALBUMIN 2.9* 2.5*   No results for input(s): LIPASE, AMYLASE in the last 168 hours. No results for input(s): AMMONIA in the last 168 hours. Coagulation Profile: Recent Labs  Lab 07/26/20 1058  INR 1.1   Cardiac Enzymes: No results for input(s): CKTOTAL, CKMB, CKMBINDEX, TROPONINI in the last 168 hours. BNP (last 3 results) No results for input(s): PROBNP in the last 8760 hours. HbA1C: No results for input(s): HGBA1C in the last 72 hours. CBG: No results for input(s): GLUCAP in the last 168 hours. Lipid Profile: No results for input(s): CHOL, HDL, LDLCALC, TRIG, CHOLHDL, LDLDIRECT in the last 72 hours. Thyroid Function Tests: No results for input(s): TSH, T4TOTAL, FREET4, T3FREE, THYROIDAB in the last 72 hours. Anemia Panel: No results for input(s): VITAMINB12, FOLATE, FERRITIN, TIBC, IRON, RETICCTPCT in the last 72 hours. Sepsis Labs: Recent Labs  Lab 07/26/20 1058 07/26/20 1113 07/29/20 0159  PROCALCITON  --   --  0.29  LATICACIDVEN 1.3 1.4  --     Recent Results (from the past 240 hour(s))  Resp Panel by RT-PCR (Flu A&B, Covid) Nasopharyngeal Swab     Status: None   Collection Time: 07/26/20 10:16 AM   Specimen: Nasopharyngeal Swab; Nasopharyngeal(NP) swabs in vial transport medium  Result Value Ref Range Status   SARS Coronavirus 2 by RT PCR NEGATIVE NEGATIVE Final    Comment: (NOTE) SARS-CoV-2 target nucleic acids are NOT  DETECTED.  The SARS-CoV-2 RNA is generally detectable in upper respiratory specimens during the acute phase of infection. The lowest concentration of SARS-CoV-2 viral copies this assay can detect is 138 copies/mL. A negative result does not preclude SARS-Cov-2 infection and should not be used as the sole basis for treatment or other patient management decisions. A negative result may occur with  improper specimen collection/handling, submission of specimen other than nasopharyngeal swab, presence of viral mutation(s) within the areas targeted  by this assay, and inadequate number of viral copies(<138 copies/mL). A negative result must be combined with clinical observations, patient history, and epidemiological information. The expected result is Negative.  Fact Sheet for Patients:  BloggerCourse.com  Fact Sheet for Healthcare Providers:  SeriousBroker.it  This test is no t yet approved or cleared by the [place] FDA and  has been authorized for detection and/or diagnosis of SARS-CoV-2 by FDA under an Emergency Use Authorization (EUA). This EUA will remain  in effect (meaning this test can be used) for the duration of the COVID-19 declaration under Section 564(b)(1) of the Act, 21 U.S.C.section 360bbb-3(b)(1), unless the authorization is terminated  or revoked sooner.       Influenza A by PCR NEGATIVE NEGATIVE Final   Influenza B by PCR NEGATIVE NEGATIVE Final    Comment: (NOTE) The Xpert Xpress SARS-CoV-2/FLU/RSV plus assay is intended as an aid in the diagnosis of influenza from Nasopharyngeal swab specimens and should not be used as a sole basis for treatment. Nasal washings and aspirates are unacceptable for Xpert Xpress SARS-CoV-2/FLU/RSV testing.  Fact Sheet for Patients: BloggerCourse.com  Fact Sheet for Healthcare Providers: SeriousBroker.it  This test is not yet  approved or cleared by the [place] FDA and has been authorized for detection and/or diagnosis of SARS-CoV-2 by FDA under an Emergency Use Authorization (EUA). This EUA will remain in effect (meaning this test can be used) for the duration of the COVID-19 declaration under Section 564(b)(1) of the Act, 21 U.S.C. section 360bbb-3(b)(1), unless the authorization is terminated or revoked.  Performed at [facility], [address] [place], [place] [address]   Blood Culture (routine x 2)     Status: Abnormal (Preliminary result)   Collection Time: 07/26/20 11:13 AM   Specimen: BLOOD  Result Value Ref Range Status   Specimen Description BLOOD LEFT ANTECUBITAL  Final   Special Requests   Final    BOTTLES DRAWN AEROBIC AND ANAEROBIC Blood Culture results may not be optimal due to an excessive volume of blood received in culture bottles   Culture  Setup Time   Final    GRAM NEGATIVE RODS AEROBIC BOTTLE ONLY CRITICAL VALUE NOTED.  VALUE IS CONSISTENT WITH PREVIOUSLY REPORTED AND CALLED VALUE.    Culture (A)  Final    HAEMOPHILUS INFLUENZAE BETA LACTAMASE POSITIVE Performed at [facility], [address], [place], [place] [address]    Report Status PENDING  Incomplete  Blood Culture (routine x 2)     Status: Abnormal (Preliminary result)   Collection Time: 07/26/20 11:13 AM   Specimen: BLOOD LEFT HAND  Result Value Ref Range Status   Specimen Description BLOOD LEFT HAND  Final   Special Requests   Final    BOTTLES DRAWN AEROBIC AND ANAEROBIC Blood Culture adequate volume   Culture  Setup Time   Final    GRAM NEGATIVE RODS AEROBIC BOTTLE ONLY CRITICAL RESULT CALLED TO, READ BACK BY AND VERIFIED WITHMelven Sartorius PHARMD, AT 0224 07/28/20 DV    Culture (A)  Final    HAEMOPHILUS INFLUENZAE BETA LACTAMASE POSITIVE HEALTH DEPARTMENT NOTIFIED Performed at [facility], [address] [place], [place] [address]    Report Status PENDING  Incomplete  Blood Culture  ID Panel (Reflexed)     Status: Abnormal   Collection Time: 07/26/20 11:13 AM  Result Value Ref Range Status   Enterococcus faecalis NOT DETECTED NOT DETECTED Final   Enterococcus Faecium NOT DETECTED NOT DETECTED  Final   Listeria monocytogenes NOT DETECTED NOT DETECTED Final   Staphylococcus species NOT DETECTED NOT DETECTED Final   Staphylococcus aureus (BCID) NOT DETECTED NOT DETECTED Final   Staphylococcus epidermidis NOT DETECTED NOT DETECTED Final   Staphylococcus lugdunensis NOT DETECTED NOT DETECTED Final   Streptococcus species NOT DETECTED NOT DETECTED Final   Streptococcus agalactiae NOT DETECTED NOT DETECTED Final   Streptococcus pneumoniae NOT DETECTED NOT DETECTED Final   Streptococcus pyogenes NOT DETECTED NOT DETECTED Final   A.calcoaceticus-baumannii NOT DETECTED NOT DETECTED Final   Bacteroides fragilis NOT DETECTED NOT DETECTED Final   Enterobacterales NOT DETECTED NOT DETECTED Final   Enterobacter cloacae complex NOT DETECTED NOT DETECTED Final   Escherichia coli NOT DETECTED NOT DETECTED Final   Klebsiella aerogenes NOT DETECTED NOT DETECTED Final   Klebsiella oxytoca NOT DETECTED NOT DETECTED Final   Klebsiella pneumoniae NOT DETECTED NOT DETECTED Final   Proteus species NOT DETECTED NOT DETECTED Final   Salmonella species NOT DETECTED NOT DETECTED Final   Serratia marcescens NOT DETECTED NOT DETECTED Final   Haemophilus influenzae DETECTED (A) NOT DETECTED Final    Comment: CRITICAL RESULT CALLED TO, READ BACK BY AND VERIFIED WITH: J. LEDFORD PHARMD, AT 0224 07/28/20 DV    Neisseria meningitidis NOT DETECTED NOT DETECTED Final   Pseudomonas aeruginosa NOT DETECTED NOT DETECTED Final   Stenotrophomonas maltophilia NOT DETECTED NOT DETECTED Final   Candida albicans NOT DETECTED NOT DETECTED Final   Candida auris NOT DETECTED NOT DETECTED Final   Candida glabrata NOT DETECTED NOT DETECTED Final   Candida krusei NOT DETECTED NOT DETECTED Final   Candida  parapsilosis NOT DETECTED NOT DETECTED Final   Candida tropicalis NOT DETECTED NOT DETECTED Final   Cryptococcus neoformans/gattii NOT DETECTED NOT DETECTED Final    Comment: Performed at [facility], [address], [place], [place] [address]     Radiology Studies: No results found.  Scheduled Meds:  amLODipine  10 mg Oral Daily   aspirin EC  81 mg Oral Daily   cholecalciferol  1,000 Units Oral Daily   enoxaparin (LOVENOX) injection  40 mg Subcutaneous Daily   escitalopram  20 mg Oral Daily   fluticasone furoate-vilanterol  1 puff Inhalation Daily   folic acid  0.5 mg Oral Daily   ipratropium-albuterol  3 mL Nebulization BID   lactose free nutrition  237 mL Oral Q24H   lisinopril  10 mg Oral Daily   multivitamin with minerals  1 tablet Oral Daily   Continuous Infusions:  cefTRIAXone (ROCEPHIN)  IV 200 mL/hr at 08/01/20 4431     LOS: 6 days   Darlin Priestly, MD [facility] Pager On Amion  If 7PM-7AM, please contact night-coverage 08/01/2020, 3:31 PM

## 2020-08-02 LAB — CBC
HCT: 33 % — ABNORMAL LOW (ref 36.0–46.0)
Hemoglobin: 10.9 g/dL — ABNORMAL LOW (ref 12.0–15.0)
MCH: 30.4 pg (ref 26.0–34.0)
MCHC: 33 g/dL (ref 30.0–36.0)
MCV: 92.2 fL (ref 80.0–100.0)
Platelets: 336 10*3/uL (ref 150–400)
RBC: 3.58 MIL/uL — ABNORMAL LOW (ref 3.87–5.11)
RDW: 14.6 % (ref 11.5–15.5)
WBC: 11.7 10*3/uL — ABNORMAL HIGH (ref 4.0–10.5)
nRBC: 0 % (ref 0.0–0.2)

## 2020-08-02 LAB — HEPATIC FUNCTION PANEL
ALT: 27 U/L (ref 0–44)
AST: 22 U/L (ref 15–41)
Albumin: 2.4 g/dL — ABNORMAL LOW (ref 3.5–5.0)
Alkaline Phosphatase: 49 U/L (ref 38–126)
Bilirubin, Direct: 0.1 mg/dL (ref 0.0–0.2)
Indirect Bilirubin: 0.3 mg/dL (ref 0.3–0.9)
Total Bilirubin: 0.4 mg/dL (ref 0.3–1.2)
Total Protein: 5.1 g/dL — ABNORMAL LOW (ref 6.5–8.1)

## 2020-08-02 LAB — BASIC METABOLIC PANEL
Anion gap: 7 (ref 5–15)
BUN: 12 mg/dL (ref 8–23)
CO2: 34 mmol/L — ABNORMAL HIGH (ref 22–32)
Calcium: 8.4 mg/dL — ABNORMAL LOW (ref 8.9–10.3)
Chloride: 93 mmol/L — ABNORMAL LOW (ref 98–111)
Creatinine, Ser: 0.8 mg/dL (ref 0.44–1.00)
GFR, Estimated: >60 mL/min (ref >60)
Glucose, Bld: 95 mg/dL (ref 70–99)
Potassium: 4.5 mmol/L (ref 3.5–5.1)
Sodium: 134 mmol/L — ABNORMAL LOW (ref 135–145)

## 2020-08-02 LAB — MAGNESIUM: Magnesium: 2 mg/dL (ref 1.7–2.4)

## 2020-08-02 MED ORDER — LISINOPRIL 20 MG PO TABS
20.0000 mg | ORAL_TABLET | Freq: Every day | ORAL | Status: DC
  Administered 2020-08-03: 20 mg via ORAL
  Filled 2020-08-02: qty 1

## 2020-08-02 NOTE — Care Management Important Message (Signed)
Important Message  Patient Details  Name: Lisa Owen MRN: [id number] Date of Birth: 03-15-1950   Medicare Important Message Given:  Yes     Louna Rothgeb P Nilson Tabora 08/02/2020, 2:43 PM

## 2020-08-02 NOTE — Progress Notes (Signed)
Patient states she doesn't need the BiPAP at night and hasn't for the last several nights. Vitals are stable on 3L [place] at this time.

## 2020-08-02 NOTE — Progress Notes (Signed)
PROGRESS NOTE    Lisa FrederickSybil B Owen  UJW:[id number] DOB: 03/08/1950 DOA: 07/26/2020 PCP: Blair HeysEhinger, Robert, MD    Brief Narrative:  70 y.o. female with medical history significant of mild persistent asthma, HTN, osteopenia, presented with asthma-like symptoms.  Assessment & Plan:   Active Problems:   Hypoxia   Pneumonia   PNA (pneumonia)  Acute hypoxic respiratory failure 2/2 Right LL PNA --started on ceftriaxone and Zithromax on admission, azithro since d/c'ed by ID. --O2 requirement increased to 7L on 6/23, slow to wean --finished 7 days of ceftriaxone Plan: --Continue supplemental O2 to keep sats >=92%, wean as tolerated --will likely go home with home O2  Sepsis, POA --tachycardia, leukocytosis on presentation, with source of infection from blood and lungs. --treat infection as above  H influenza bacteremia --ID auto-consulted --finished 7 days of ceftriaxone --no need for repeat blood cx, per ID   Suspected Acute asthma exacerbation vs undiagnosed COPD exacerbation 2nd hand smoke --completed steroid burst Plan: --cont chest PT --cont DuoNeb and inhaler  Acute hypercapnic respiratory failure -Pt noted to be hypercarbic, requiring bipap support, since off of it   Hyponatremia, resolved -HCTZ was changed to amlodipine at time of presentation   Hypertension -HCTZ was changed to amlodipine at time of presentation --BP better controlled after increasing BP meds. Plan: --cont amlodipine --increase Lisinopril to 20 mg daily  Anxiety --cont home Lexapro   DVT prophylaxis: Lovenox subq Code Status: Full Family Communication:  Status is: Inpatient  Remains inpatient appropriate because:Inpatient level of care appropriate due to severity of illness  Dispo: The patient is from: Home              Anticipated d/c is to: Home              Patient currently is not medically stable to d/c.  Need 4L O2, new requirement.    Difficult to place patient No     Consultants:    Procedures:    Antimicrobials: Anti-infectives (From admission, onward)    Start     Dose/Rate Route Frequency Ordered Stop   07/26/20 1030  cefTRIAXone (ROCEPHIN) 2 g in sodium chloride 0.9 % 100 mL IVPB        2 g 200 mL/hr over 30 Minutes Intravenous Every 24 hours 07/26/20 1016 08/01/20 2359   07/26/20 1030  azithromycin (ZITHROMAX) 500 mg in sodium chloride 0.9 % 250 mL IVPB  Status:  Discontinued        500 mg 250 mL/hr over 60 Minutes Intravenous Every 24 hours 07/26/20 1016 07/28/20 0936       Subjective: Pt reported feeling better today, and for the first time, O2 requirement decreased down to 3L.     Objective: Vitals:   08/01/20 1349 08/01/20 1957 08/02/20 0413 08/02/20 1013  BP: (!) 134/56  140/67 (!) 149/63  Pulse: 77  67 76  Resp: 16     Temp: 98.2 F (36.8 C)  97.8 F (36.6 C)   TempSrc: Oral     SpO2: 97% 99% 97% 94%  Weight:      Height:       No intake or output data in the 24 hours ending 08/02/20 1451   Filed Weights   07/26/20 1024  Weight: 71.2 kg    Examination: Constitutional: NAD, AAOx3 HEENT: conjunctivae and lids normal, EOMI CV: No cyanosis.   RESP: normal respiratory effort, on 3L Extremities: Mild edema in BLE SKIN: warm, dry Neuro: II - XII grossly intact.  Psych: Normal mood and affect.  Appropriate judgement and reason    Data Reviewed: I have personally reviewed following labs and imaging studies  CBC: Recent Labs  Lab 07/29/20 0159 07/30/20 0102 07/31/20 0319 08/01/20 0212 08/02/20 0243  WBC 20.2* 15.6* 16.0* 14.3* 11.7*  HGB 11.1* 11.5* 11.4* 10.8* 10.9*  HCT 33.4* 35.1* 34.1* 33.0* 33.0*  MCV 92.0 92.4 92.2 92.7 92.2  PLT 356 356 348 347 336   Basic Metabolic Panel: Recent Labs  Lab 07/29/20 0159 07/30/20 0102 07/31/20 0319 08/01/20 0212 08/02/20 0243  NA 135 137 137 136 134*  K 3.8 4.2 5.1 4.3 4.5  CL 94* 94* 96* 93* 93*  CO2 35* 35* 37* 36* 34*  GLUCOSE 110* 105* 102* 114*  95  BUN 18 20 14 14 12   CREATININE 0.62 0.52 0.66 0.57 0.80  CALCIUM 8.9 9.0 8.7* 8.7* 8.4*  MG 2.2 2.2 2.1 2.1 2.0   GFR: Estimated Creatinine Clearance: 66.6 mL/min (by C-G formula based on SCr of 0.8 mg/dL). Liver Function Tests: Recent Labs  Lab 07/28/20 0114 08/02/20 0243  AST 20 22  ALT 29 27  ALKPHOS 81 49  BILITOT 0.6 0.4  PROT 6.3* 5.1*  ALBUMIN 2.5* 2.4*   No results for input(s): LIPASE, AMYLASE in the last 168 hours. No results for input(s): AMMONIA in the last 168 hours. Coagulation Profile: No results for input(s): INR, PROTIME in the last 168 hours.  Cardiac Enzymes: No results for input(s): CKTOTAL, CKMB, CKMBINDEX, TROPONINI in the last 168 hours. BNP (last 3 results) No results for input(s): PROBNP in the last 8760 hours. HbA1C: No results for input(s): HGBA1C in the last 72 hours. CBG: No results for input(s): GLUCAP in the last 168 hours. Lipid Profile: No results for input(s): CHOL, HDL, LDLCALC, TRIG, CHOLHDL, LDLDIRECT in the last 72 hours. Thyroid Function Tests: No results for input(s): TSH, T4TOTAL, FREET4, T3FREE, THYROIDAB in the last 72 hours. Anemia Panel: No results for input(s): VITAMINB12, FOLATE, FERRITIN, TIBC, IRON, RETICCTPCT in the last 72 hours. Sepsis Labs: Recent Labs  Lab 07/29/20 0159  PROCALCITON 0.29    Recent Results (from the past 240 hour(s))  Resp Panel by RT-PCR (Flu A&B, Covid) Nasopharyngeal Swab     Status: None   Collection Time: 07/26/20 10:16 AM   Specimen: Nasopharyngeal Swab; Nasopharyngeal(NP) swabs in vial transport medium  Result Value Ref Range Status   SARS Coronavirus 2 by RT PCR NEGATIVE NEGATIVE Final    Comment: (NOTE) SARS-CoV-2 target nucleic acids are NOT DETECTED.  The SARS-CoV-2 RNA is generally detectable in upper respiratory specimens during the acute phase of infection. The lowest concentration of SARS-CoV-2 viral copies this assay can detect is 138 copies/mL. A negative result does  not preclude SARS-Cov-2 infection and should not be used as the sole basis for treatment or other patient management decisions. A negative result may occur with  improper specimen collection/handling, submission of specimen other than nasopharyngeal swab, presence of viral mutation(s) within the areas targeted by this assay, and inadequate number of viral copies(<138 copies/mL). A negative result must be combined with clinical observations, patient history, and epidemiological information. The expected result is Negative.  Fact Sheet for Patients:  07/28/20  Fact Sheet for Healthcare Providers:  BloggerCourse.com  This test is no t yet approved or cleared by the [place] FDA and  has been authorized for detection and/or diagnosis of SARS-CoV-2 by FDA under an Emergency Use Authorization (EUA). This EUA will remain  in effect (meaning  this test can be used) for the duration of the COVID-19 declaration under Section 564(b)(1) of the Act, 21 U.S.C.section 360bbb-3(b)(1), unless the authorization is terminated  or revoked sooner.       Influenza A by PCR NEGATIVE NEGATIVE Final   Influenza B by PCR NEGATIVE NEGATIVE Final    Comment: (NOTE) The Xpert Xpress SARS-CoV-2/FLU/RSV plus assay is intended as an aid in the diagnosis of influenza from Nasopharyngeal swab specimens and should not be used as a sole basis for treatment. Nasal washings and aspirates are unacceptable for Xpert Xpress SARS-CoV-2/FLU/RSV testing.  Fact Sheet for Patients: BloggerCourse.com  Fact Sheet for Healthcare Providers: [employer]ious[employer]ker.it  This test is not yet approved or cleared by the [place] FDA and has been authorized for detection and/or diagnosis of SARS-CoV-2 by FDA under an Emergency Use Authorization (EUA). This EUA will remain in effect (meaning this test can be used) for the  duration of the COVID-19 declaration under Section 564(b)(1) of the Act, 21 U.S.C. section 360bbb-3(b)(1), unless the authorization is terminated or revoked.  Performed at [facility], [address] [place], [place] [address]   Blood Culture (routine x 2)     Status: Abnormal (Preliminary result)   Collection Time: 07/26/20 11:13 AM   Specimen: BLOOD  Result Value Ref Range Status   Specimen Description BLOOD LEFT ANTECUBITAL  Final   Special Requests   Final    BOTTLES DRAWN AEROBIC AND ANAEROBIC Blood Culture results may not be optimal due to an excessive volume of blood received in culture bottles   Culture  Setup Time   Final    GRAM NEGATIVE RODS AEROBIC BOTTLE ONLY CRITICAL VALUE NOTED.  VALUE IS CONSISTENT WITH PREVIOUSLY REPORTED AND CALLED VALUE.    Culture (A)  Final    HAEMOPHILUS INFLUENZAE BETA LACTAMASE POSITIVE Performed at [facility], [address] [place], [place] [address]    Report Status PENDING  Incomplete  Blood Culture (routine x 2)     Status: Abnormal (Preliminary result)   Collection Time: 07/26/20 11:13 AM   Specimen: BLOOD LEFT HAND  Result Value Ref Range Status   Specimen Description BLOOD LEFT HAND  Final   Special Requests   Final    BOTTLES DRAWN AEROBIC AND ANAEROBIC Blood Culture adequate volume   Culture  Setup Time   Final    GRAM NEGATIVE RODS AEROBIC BOTTLE ONLY CRITICAL RESULT CALLED TO, READ BACK BY AND VERIFIED WITHMelven Sartorius PHARMD, AT 0224 07/28/20 DV    Culture (A)  Final    HAEMOPHILUS INFLUENZAE BETA LACTAMASE POSITIVE HEALTH DEPARTMENT NOTIFIED Performed at [facility], [address] [place], [place] [address]    Report Status PENDING  Incomplete  Blood Culture ID Panel (Reflexed)     Status: Abnormal   Collection Time: 07/26/20 11:13 AM  Result Value Ref Range Status   Enterococcus faecalis NOT DETECTED NOT DETECTED Final   Enterococcus Faecium NOT DETECTED NOT DETECTED Final   Listeria  monocytogenes NOT DETECTED NOT DETECTED Final   Staphylococcus species NOT DETECTED NOT DETECTED Final   Staphylococcus aureus (BCID) NOT DETECTED NOT DETECTED Final   Staphylococcus epidermidis NOT DETECTED NOT DETECTED Final   Staphylococcus lugdunensis NOT DETECTED NOT DETECTED Final   Streptococcus species NOT DETECTED NOT DETECTED Final   Streptococcus agalactiae NOT DETECTED NOT DETECTED Final   Streptococcus pneumoniae NOT DETECTED NOT DETECTED Final   Streptococcus pyogenes NOT DETECTED NOT DETECTED Final   A.calcoaceticus-baumannii  NOT DETECTED NOT DETECTED Final   Bacteroides fragilis NOT DETECTED NOT DETECTED Final   Enterobacterales NOT DETECTED NOT DETECTED Final   Enterobacter cloacae complex NOT DETECTED NOT DETECTED Final   Escherichia coli NOT DETECTED NOT DETECTED Final   Klebsiella aerogenes NOT DETECTED NOT DETECTED Final   Klebsiella oxytoca NOT DETECTED NOT DETECTED Final   Klebsiella pneumoniae NOT DETECTED NOT DETECTED Final   Proteus species NOT DETECTED NOT DETECTED Final   Salmonella species NOT DETECTED NOT DETECTED Final   Serratia marcescens NOT DETECTED NOT DETECTED Final   Haemophilus influenzae DETECTED (A) NOT DETECTED Final    Comment: CRITICAL RESULT CALLED TO, READ BACK BY AND VERIFIED WITH: J. LEDFORD PHARMD, AT 0224 07/28/20 DV    Neisseria meningitidis NOT DETECTED NOT DETECTED Final   Pseudomonas aeruginosa NOT DETECTED NOT DETECTED Final   Stenotrophomonas maltophilia NOT DETECTED NOT DETECTED Final   Candida albicans NOT DETECTED NOT DETECTED Final   Candida auris NOT DETECTED NOT DETECTED Final   Candida glabrata NOT DETECTED NOT DETECTED Final   Candida krusei NOT DETECTED NOT DETECTED Final   Candida parapsilosis NOT DETECTED NOT DETECTED Final   Candida tropicalis NOT DETECTED NOT DETECTED Final   Cryptococcus neoformans/gattii NOT DETECTED NOT DETECTED Final    Comment: Performed at [facility], [address], [place],  [place] [address]     Radiology Studies: No results found.  Scheduled Meds:  amLODipine  10 mg Oral Daily   aspirin EC  81 mg Oral Daily   cholecalciferol  1,000 Units Oral Daily   enoxaparin (LOVENOX) injection  40 mg Subcutaneous Daily   escitalopram  20 mg Oral Daily   fluticasone furoate-vilanterol  1 puff Inhalation Daily   folic acid  0.5 mg Oral Daily   ipratropium-albuterol  3 mL Nebulization BID   lactose free nutrition  237 mL Oral Q24H   lisinopril  10 mg Oral Daily   multivitamin with minerals  1 tablet Oral Daily   nystatin  5 mL Oral QID   Continuous Infusions:     LOS: 7 days   Darlin Priestly, MD [facility] Pager On Amion  If 7PM-7AM, please contact night-coverage 08/02/2020, 2:51 PM

## 2020-08-02 NOTE — Plan of Care (Signed)
  Problem: Education: Goal: Knowledge of General Education information will improve Description: Including pain rating scale, medication(s)/side effects and non-pharmacologic comfort measures Outcome: Progressing   Problem: Activity: Goal: Risk for activity intolerance will decrease Outcome: Progressing   Problem: Nutrition: Goal: Adequate nutrition will be maintained Outcome: Progressing   Problem: Coping: Goal: Level of anxiety will decrease Outcome: Progressing   

## 2020-08-02 NOTE — Progress Notes (Signed)
Physical Therapy Treatment Patient Details Name: Lisa Owen MRN: [id number] DOB: [birth year] Today's Date: 08/02/2020    History of Present Illness 70 y.o. female presenting to ED on 6/20 with worsening SOB, productive cough and wheezing. SpO2 in 70's when EMS arrived. Patient admitted with PNA, acute hypoxic respiratory failure and acute asthma exacerbation. PMHx significant for mild persistent asthma, HTN and osteopenia.    PT Comments    Pt admitted with above diagnosis. Pt was able to ambulate with rollator increasing distance today with 1 seated rest break. Pt requiring 4LO2 to maintain sats with activity and 3L at rest. Making progress slowly. Good safety overall with rollator.  Pt currently with functional limitations due to balance and endurance deficits. Pt will benefit from skilled PT to increase their independence and safety with mobility to allow discharge to the venue listed below.     SATURATION QUALIFICATIONS: (This note is used to comply with regulatory documentation for home oxygen)  Patient Saturations on Room Air at Rest = 85%  Patient Saturations on Room Air while Ambulating = NT as desats on RA at rest  Patient Saturations on 4 Liters of oxygen while Ambulating = 91%  Please briefly explain why patient needs home oxygen:Pt needs 3LO2 at rest and 4LO2 with ambulation to maintain sats >90%.    Follow Up Recommendations  Home health PT;Supervision - Intermittent     Equipment Recommendations  Other (comment) (rollator)    Recommendations for Other Services       Precautions / Restrictions Precautions Precautions: Other (comment) Precaution Comments: Monitor vitals Restrictions Weight Bearing Restrictions: No    Mobility  Bed Mobility               General bed mobility comments: Seated in recliner upon entry.    Transfers Overall transfer level: Independent   Transfers: Sit to/from Stand Sit to Stand: Supervision         General transfer  comment: vc for use of rollator and how to operate brakes  Ambulation/Gait Ambulation/Gait assistance: Supervision Gait Distance (Feet): 100 Feet (100 feet x 2) Assistive device: 4-wheeled walker Gait Pattern/deviations: Decreased stride length;Step-through pattern Gait velocity: decreased Gait velocity interpretation: <1.31 ft/sec, indicative of household ambulator General Gait Details: Pt on 3LO2 on arrival with sats 92%.  Pt able to incr distance to hallway today with mask on and sats >90% on 4L with activity.  Pt took one seated rest break on the rollator.   Stairs             Wheelchair Mobility    Modified Rankin (Stroke Patients Only)       Balance Overall balance assessment: Mild deficits observed, not formally tested Sitting-balance support: No upper extremity supported Sitting balance-Leahy Scale: Good     Standing balance support: No upper extremity supported Standing balance-Leahy Scale: Fair                              Cognition Arousal/Alertness: Awake/alert Behavior During Therapy: WFL for tasks assessed/performed Overall Cognitive Status: Within Functional Limits for tasks assessed                                        Exercises General Exercises - Upper Extremity Shoulder Flexion: AROM;10 reps;Seated;Both Elbow Flexion: AROM;Both;10 reps;Seated Elbow Extension: AROM;Both;10 reps;Seated General Exercises - Lower Extremity Long Arc Quad:  AROM;Both;10 reps;Seated Straight Leg Raises: AROM;Both;10 reps;Seated Hip Flexion/Marching: AROM;Both;10 reps;Seated Other Exercises Other Exercises: Sit to stand x5 from low recliner    General Comments General comments (skin integrity, edema, etc.): SpO2 >95% on 3L throughout treatment session.      Pertinent Vitals/Pain Pain Assessment: No/denies pain    Home Living                      Prior Function            PT Goals (current goals can now be found in  the care plan section) Acute Rehab PT Goals Patient Stated Goal: To feel better. Progress towards PT goals: Progressing toward goals    Frequency    Min 3X/week      PT Plan Current plan remains appropriate    Co-evaluation              AM-PAC PT "6 Clicks" Mobility   Outcome Measure  Help needed turning from your back to your side while in a flat bed without using bedrails?: None Help needed moving from lying on your back to sitting on the side of a flat bed without using bedrails?: None Help needed moving to and from a bed to a chair (including a wheelchair)?: A Little Help needed standing up from a chair using your arms (e.g., wheelchair or bedside chair)?: A Little Help needed to walk in hospital room?: A Little Help needed climbing 3-5 steps with a railing? : A Little 6 Click Score: 20    End of Session Equipment Utilized During Treatment: Oxygen;Gait belt Activity Tolerance: Patient tolerated treatment well;Patient limited by fatigue Patient left: in chair;with call bell/phone within reach;with chair alarm set Nurse Communication: Mobility status PT Visit Diagnosis: Other abnormalities of gait and mobility (R26.89)     Time: 5009-3818 PT Time Calculation (min) (ACUTE ONLY): 18 min  Charges:  $Gait Training: 8-22 mins                     Dmauri Rosenow M,PT Acute Rehab Services [phone] (pager)    Bevelyn Buckles 08/02/2020, 1:06 PM

## 2020-08-02 NOTE — Plan of Care (Signed)
  Problem: Nutrition: Goal: Adequate nutrition will be maintained Outcome: Progressing   Problem: Coping: Goal: Level of anxiety will decrease Outcome: Progressing   Problem: Elimination: Goal: Will not experience complications related to bowel motility Outcome: Progressing   Problem: Safety: Goal: Ability to remain free from injury will improve Outcome: Progressing   

## 2020-08-02 NOTE — Progress Notes (Signed)
Occupational Therapy Treatment/Discharge Patient Details Name: Lisa Owen MRN: [id number] DOB: Feb 02, 1951 Today's Date: 08/02/2020    History of present illness 70 y.o. female presenting to ED on 6/20 with worsening SOB, productive cough and wheezing. SpO2 in 70's when EMS arrived. Patient admitted with PNA, acute hypoxic respiratory failure and acute asthma exacerbation. PMHx significant for mild persistent asthma, HTN and osteopenia.   OT comments  At time of initial evaluation, patient required 6-8L O2 via [place] during functional activity. Patient was limited by decrease activity tolerance and generalized weakness requiring increased time/effort to complete basic self-care tasks including toileting/hygiene/clothing management. Patient currently completing self-care tasks with Mod I on 3L O2 via [place] and 10 reps of BUE/BLE HEP without DOE. Written and verbal education provided on energy conservation techniques in prep for ADLs/IADLs. Patient expressed verbal understanding. OT to sign off at this time.    Follow Up Recommendations  No OT follow up;Supervision - Intermittent    Equipment Recommendations  None recommended by OT (Patient has necessary DME)    Recommendations for Other Services      Precautions / Restrictions Precautions Precautions: Other (comment) Precaution Comments: Monitor vitals Restrictions Weight Bearing Restrictions: No       Mobility Bed Mobility               General bed mobility comments: Seated in recliner upon entry.    Transfers Overall transfer level: Independent                    Balance Overall balance assessment: Mild deficits observed, not formally tested                                         ADL either performed or assessed with clinical judgement   ADL Overall ADL's : Needs assistance/impaired                                       General ADL Comments: Patient declined grooming tasks this  date.     Vision       Perception     Praxis      Cognition Arousal/Alertness: Awake/alert Behavior During Therapy: WFL for tasks assessed/performed Overall Cognitive Status: Within Functional Limits for tasks assessed                                          Exercises Exercises: General Upper Extremity;General Lower Extremity;Other exercises General Exercises - Upper Extremity Shoulder Flexion: AROM;10 reps;Seated;Both Elbow Flexion: AROM;Both;10 reps;Seated Elbow Extension: AROM;Both;10 reps;Seated General Exercises - Lower Extremity Straight Leg Raises: AROM;Both;10 reps;Seated Hip Flexion/Marching: AROM;Both;10 reps;Seated Other Exercises Other Exercises: Sit to stand x5 from low recliner   Shoulder Instructions       General Comments SpO2 >95% on 3L throughout treatment session.    Pertinent Vitals/ Pain          Home Living                                          Prior Functioning/Environment  Frequency  Min 2X/week        Progress Toward Goals  OT Goals(current goals can now be found in the care plan section)  Progress towards OT goals: Goals met/education completed, patient discharged from OT  Acute Rehab OT Goals Patient Stated Goal: To feel better. OT Goal Formulation: With patient Time For Goal Achievement: 08/10/20 Potential to Achieve Goals: Good ADL Goals Additional ADL Goal #1: Patient will complete ADLs with Mod I and good carryover of energy conservation techniques. Additional ADL Goal #2: Patient will tolerate 15 minutes of therapeutic activity without need for rest break indicating increased activity tolerance. Additional ADL Goal #3: Patient will recall and demo 3 energy conservation techniques in prep for ADLs.  Plan Frequency remains appropriate;Discharge plan remains appropriate    Co-evaluation                 AM-PAC OT "6 Clicks" Daily Activity     Outcome  Measure   Help from another person eating meals?: None Help from another person taking care of personal grooming?: None Help from another person toileting, which includes using toliet, bedpan, or urinal?: None Help from another person bathing (including washing, rinsing, drying)?: A Little Help from another person to put on and taking off regular upper body clothing?: None Help from another person to put on and taking off regular lower body clothing?: None 6 Click Score: 23    End of Session Equipment Utilized During Treatment: Oxygen  OT Visit Diagnosis: Muscle weakness (generalized) (M62.81)   Activity Tolerance Patient tolerated treatment well;Patient limited by fatigue   Patient Left in chair;with call bell/phone within reach   Nurse Communication Mobility status        Time: 1150-1209 OT Time Calculation (min): 19 min  Charges: OT General Charges $OT Visit: 1 Visit OT Treatments $Therapeutic Exercise: 8-22 mins  Gregory Dowe H. OTR/L Supplemental OT, Department of rehab services [phone]   Mazen Marcin R H. 08/02/2020, 12:15 PM

## 2020-08-02 NOTE — Progress Notes (Signed)
SATURATION QUALIFICATIONS: (This note is used to comply with regulatory documentation for home oxygen)  Patient Saturations on Room Air at Rest = 85%  Patient Saturations on Room Air while Ambulating = NT as desats on RA at rest  Patient Saturations on 4 Liters of oxygen while Ambulating = 91%  Please briefly explain why patient needs home oxygen:Pt needs 3LO2 at rest and 4LO2 with ambulation to maintain sats >90%.   Tawnia Schirm M,PT Acute Rehab Services [phone] (pager)

## 2020-08-03 ENCOUNTER — Other Ambulatory Visit: Payer: Self-pay | Admitting: Hospitalist

## 2020-08-03 DIAGNOSIS — R531 Weakness: Secondary | ICD-10-CM

## 2020-08-03 LAB — BASIC METABOLIC PANEL
Anion gap: 7 (ref 5–15)
BUN: 9 mg/dL (ref 8–23)
CO2: 34 mmol/L — ABNORMAL HIGH (ref 22–32)
Calcium: 8.7 mg/dL — ABNORMAL LOW (ref 8.9–10.3)
Chloride: 94 mmol/L — ABNORMAL LOW (ref 98–111)
Creatinine, Ser: 0.68 mg/dL (ref 0.44–1.00)
GFR, Estimated: >60 mL/min (ref >60)
Glucose, Bld: 98 mg/dL (ref 70–99)
Potassium: 4.3 mmol/L (ref 3.5–5.1)
Sodium: 135 mmol/L (ref 135–145)

## 2020-08-03 LAB — CULTURE, BLOOD (ROUTINE X 2): Special Requests: ADEQUATE

## 2020-08-03 LAB — CBC
HCT: 33 % — ABNORMAL LOW (ref 36.0–46.0)
Hemoglobin: 10.8 g/dL — ABNORMAL LOW (ref 12.0–15.0)
MCH: 30.3 pg (ref 26.0–34.0)
MCHC: 32.7 g/dL (ref 30.0–36.0)
MCV: 92.7 fL (ref 80.0–100.0)
Platelets: 331 10*3/uL (ref 150–400)
RBC: 3.56 MIL/uL — ABNORMAL LOW (ref 3.87–5.11)
RDW: 14.8 % (ref 11.5–15.5)
WBC: 9.6 10*3/uL (ref 4.0–10.5)
nRBC: 0 % (ref 0.0–0.2)

## 2020-08-03 LAB — MAGNESIUM: Magnesium: 2 mg/dL (ref 1.7–2.4)

## 2020-08-03 MED ORDER — AMLODIPINE BESYLATE 10 MG PO TABS: 10.0000 mg | ORAL_TABLET | Freq: Every day | ORAL | 2 refills | Status: AC

## 2020-08-03 MED ORDER — ALBUTEROL SULFATE HFA 108 (90 BASE) MCG/ACT IN AERS: 2.0000 | INHALATION_SPRAY | Freq: Four times a day (QID) | RESPIRATORY_TRACT | 2 refills | Status: AC | PRN

## 2020-08-03 MED ORDER — FLUTICASONE FUROATE-VILANTEROL 200-25 MCG/INH IN AEPB: 1.0000 | INHALATION_SPRAY | Freq: Every day | RESPIRATORY_TRACT | 2 refills | Status: DC

## 2020-08-03 MED ORDER — LISINOPRIL 20 MG PO TABS: 20.0000 mg | ORAL_TABLET | Freq: Every day | ORAL | 0 refills | Status: DC

## 2020-08-03 MED ORDER — IPRATROPIUM-ALBUTEROL 0.5-2.5 (3) MG/3ML IN SOLN: 3.0000 mL | Freq: Four times a day (QID) | RESPIRATORY_TRACT | 2 refills | Status: AC | PRN

## 2020-08-03 NOTE — TOC Transition Note (Signed)
Transition of Care [facility]) - CM/SW Discharge Note   Patient Details  Name: Lisa Owen MRN: [id number] Date of Birth: 01/05/1951  Transition of Care [facility]) CM/SW Contact:  Lawerance Sabal, RN Phone Number: 08/03/2020, 10:37 AM   Clinical Narrative:    Spoke w patient, she said her daughter is going to pick her up from the hospital. Notified [facility] of DC. O2, nebulizer and rollator to be delivered to the room today before DC so she can take them home with her. NO other TOC needs identified.    Final next level of care: Home w Home Health Services Barriers to Discharge: No Barriers Identified   Patient Goals and CMS Choice Patient states their goals for this hospitalization and ongoing recovery are:: "walking and breathing" CMS Medicare.gov Compare Post Acute Care list provided to:: Patient Choice offered to / list presented to : Patient  Discharge Placement                       Discharge Plan and Services In-house Referral: Clinical Social Work   Post Acute Care Choice: Home Health          DME Arranged: Nebulizer machine, Oxygen, Walker rolling with seat DME Agency:  ([employer])       HH Arranged: PT HH Agency: [facility] Date [facility] Agency Contacted: 07/28/20 Time HH Agency Contacted: 1110 Representative spoke with at [facility] Agency: Kandee Keen  Social Determinants of Health (SDOH) Interventions     Readmission Risk Interventions No flowsheet data found.

## 2020-08-03 NOTE — Discharge Summary (Signed)
Physician Discharge Summary   Lisa Owen  female DOB: 10/13/1950  ZOX:[id number]  PCP: Lisa Owen, Robert, Lisa Owen  Admit date: 07/26/2020 Discharge date: 08/03/2020  Admitted From: home Disposition:  home Home Health: Yes CODE STATUS: Full code  Discharge Instructions     Ambulatory referral to Pulmonology   Complete by: As directed    Reason for referral: Asthma/COPD   Discharge instructions   Complete by: As directed    You had bad infection from H influenza in your lungs and blood stream, but you have finished your treatment with IV antibiotic.  Your oxygen requirement has improved from 7 liters down to 3 liters, so you can go home with 3 liters of oxygen to continue to recover.  Please set up with a pulmonologist as outpatient for your severe asthma.  Your blood pressure has been high, so I have increased your amlodipine and started you on Lisinopril.  Please take them as directed.   Dr. Darlin Priestlyina Aryaan Persichetti [facility]- -        Hospital Course:  For full details, please see H&P, progress notes, consult notes and ancillary notes.  Briefly,  Lisa Owen is a 70 y.o. female with medical history significant of mild persistent asthma, HTN, osteopenia, presented with severe dyspnea.   Acute hypoxic respiratory failure 2/2 Right LL PNA --started on ceftriaxone and Zithromax on admission, azithro since d/c'ed by ID. --O2 requirement increased to 7L on 6/23, slow to wean --finished 7 days of ceftriaxone --Pt was discharged when O2 requirement stably improved to 3L, and was discharged on home O2.   Sepsis, POA --tachycardia, leukocytosis on presentation, with source of infection from blood and lungs.   H influenza bacteremia --ID auto-consulted --finished 7 days of ceftriaxone, per ID rec. --no need for repeat blood cx, per ID   Suspected Acute asthma exacerbation vs undiagnosed COPD exacerbation 2nd hand smoke --completed steroid burst --received chest PT, DuoNeb and inhaler.   --Pt was  advised to establish with pulm as outpatient.   Acute hypercapnic respiratory failure -Pt noted to be hypercarbic, requiring bipap support, since off of it   Hyponatremia, resolved -HCTZ was changed to amlodipine at time of presentation   Hypertension -HCTZ was changed to amlodipine at time of presentation --BP better controlled after increasing amlodipine and started on Lisinopril 20 mg daily   Anxiety --cont home Lexapro    Discharge Diagnoses:  Active Problems:   Hypoxia   Pneumonia   PNA (pneumonia)   30 Day Unplanned Readmission Risk Score    Flowsheet Row ED to Hosp-Admission (Current) from [facility]/2020 [facility]  30 Day Unplanned Readmission Risk Score (%) 14.12 Filed at 08/03/2020 0801       This score is the patient's risk of an unplanned readmission within 30 days of being discharged (0 -100%). The score is based on dignosis, age, lab data, medications, orders, and past utilization.   Low:  0-14.9   Medium: 15-21.9   High: 22-29.9   Extreme: 30 and above          Discharge Instructions:  Allergies as of 08/03/2020       Reactions   Boniva [ibandronic Acid]    Aches and GI upset   Clarinex [desloratadine]    Fosamax [alendronate Sodium]    Sudafed [pseudoephedrine]    Hypertension Tachycardia        Medication List     STOP taking these medications    Advair Diskus 100-50 MCG/ACT  Aepb Generic drug: fluticasone-salmeterol   calcium gluconate 500 MG tablet   cholecalciferol 1000 units tablet Commonly known as: VITAMIN D   folic acid 400 MCG tablet Commonly known as: FOLVITE   hydrochlorothiazide 25 MG tablet Commonly known as: HYDRODIURIL       TAKE these medications    albuterol 108 (90 Base) MCG/ACT inhaler Commonly known as: VENTOLIN HFA Inhale 2 puffs into the lungs every 6 (six) hours as needed for wheezing or shortness of breath.   amLODipine 10 MG tablet Commonly known as: NORVASC Take 1 tablet  (10 mg total) by mouth daily. An increase from 5 mg. Start taking on: August 04, 2020 What changed:  medication strength how much to take additional instructions   aspirin EC 81 MG tablet Take 81 mg by mouth daily.   CENTRUM SILVER PO Take 1 tablet by mouth daily.   escitalopram 20 MG tablet Commonly known as: LEXAPRO Take 20 mg by mouth daily.   fluticasone furoate-vilanterol 200-25 MCG/INH Aepb Commonly known as: BREO ELLIPTA Inhale 1 puff into the lungs daily. Start taking on: August 04, 2020   guaiFENesin 600 MG 12 hr tablet Commonly known as: MUCINEX Over the counter  As needed   ipratropium-albuterol 0.5-2.5 (3) MG/3ML Soln Commonly known as: DUONEB Take 3 mLs by nebulization every 6 (six) hours as needed.   lisinopril 20 MG tablet Commonly known as: ZESTRIL Take 1 tablet (20 mg total) by mouth daily. New blood pressure medication. Start taking on: August 04, 2020   montelukast 10 MG tablet Commonly known as: SINGULAIR Take 10 mg by mouth every evening.   vitamin C 500 MG tablet Commonly known as: ASCORBIC ACID Take 500 mg by mouth daily.   VITAMIN E PO Take 1 capsule by mouth daily.               Durable Medical Equipment  (From admission, onward)           Start     Ordered   08/03/20 0900  For home use only DME oxygen  Once       Question Answer Comment  Length of Need 6 Months   Mode or (Route) Nasal cannula   Liters per Minute 3   Frequency Continuous (stationary and portable oxygen unit needed)   Oxygen delivery system Gas      08/03/20 0900   07/31/20 0930  For home use only DME Nebulizer machine  Once       Question Answer Comment  Patient needs a nebulizer to treat with the following condition Asthma   Length of Need Lifetime      07/31/20 0931             Follow-up Information     Lisa Heys, Lisa Owen Follow up in 1 week(s).   Specialty: Family Medicine Contact information: [address] [place]  [address] [phone]         Pulmonologist Follow up.   Why: Please establish with outpatient pulmonologist.                Allergies  Allergen Reactions   Boniva [Ibandronic Acid]     Aches and GI upset   Clarinex [Desloratadine]    Fosamax [Alendronate Sodium]    Sudafed [Pseudoephedrine]     Hypertension Tachycardia      The results of significant diagnostics from this hospitalization (including imaging, microbiology, ancillary and laboratory) are listed below for reference.   Consultations:  Procedures/Studies: DG CHEST PORT 1 VIEW  Result Date: 07/26/2020 CLINICAL DATA:  Shortness of breath EXAM: PORTABLE CHEST 1 VIEW COMPARISON:  None. FINDINGS: Hazy right lower lobe airspace disease. Bilateral mild interstitial thickening. No pleural effusion or pneumothorax. Heart and mediastinal contours are unremarkable. No acute osseous abnormality. IMPRESSION: Right lower lobe airspace disease most concerning for pneumonia. Bilateral interstitial thickening can be seen in the setting of atypical viral pneumonia. Electronically Signed   By: Elige Ko   On: 07/26/2020 10:42      Labs: BNP (last 3 results) Recent Labs    07/26/20 1058  BNP 338.8*   Basic Metabolic Panel: Recent Labs  Lab 07/30/20 0102 07/31/20 0319 08/01/20 0212 08/02/20 0243 08/03/20 0313  NA 137 137 136 134* 135  K 4.2 5.1 4.3 4.5 4.3  CL 94* 96* 93* 93* 94*  CO2 35* 37* 36* 34* 34*  GLUCOSE 105* 102* 114* 95 98  BUN 20 14 14 12 9   CREATININE 0.52 0.66 0.57 0.80 0.68  CALCIUM 9.0 8.7* 8.7* 8.4* 8.7*  MG 2.2 2.1 2.1 2.0 2.0   Liver Function Tests: Recent Labs  Lab 07/28/20 0114 08/02/20 0243  AST 20 22  ALT 29 27  ALKPHOS 81 49  BILITOT 0.6 0.4  PROT 6.3* 5.1*  ALBUMIN 2.5* 2.4*   No results for input(s): LIPASE, AMYLASE in the last 168 hours. No results for input(s): AMMONIA in the last 168 hours. CBC: Recent Labs  Lab 07/30/20 0102 07/31/20 0319 08/01/20 0212  08/02/20 0243 08/03/20 0313  WBC 15.6* 16.0* 14.3* 11.7* 9.6  HGB 11.5* 11.4* 10.8* 10.9* 10.8*  HCT 35.1* 34.1* 33.0* 33.0* 33.0*  MCV 92.4 92.2 92.7 92.2 92.7  PLT 356 348 347 336 331   Cardiac Enzymes: No results for input(s): CKTOTAL, CKMB, CKMBINDEX, TROPONINI in the last 168 hours. BNP: Invalid input(s): POCBNP CBG: No results for input(s): GLUCAP in the last 168 hours. D-Dimer No results for input(s): DDIMER in the last 72 hours. Hgb A1c No results for input(s): HGBA1C in the last 72 hours. Lipid Profile No results for input(s): CHOL, HDL, LDLCALC, TRIG, CHOLHDL, LDLDIRECT in the last 72 hours. Thyroid function studies No results for input(s): TSH, T4TOTAL, T3FREE, THYROIDAB in the last 72 hours.  Invalid input(s): FREET3 Anemia work up No results for input(s): VITAMINB12, FOLATE, FERRITIN, TIBC, IRON, RETICCTPCT in the last 72 hours. Urinalysis    Component Value Date/Time   COLORURINE YELLOW 09/29/2014 1008   APPEARANCEUR CLEAR 09/29/2014 1008   LABSPEC 1.007 09/29/2014 1008   PHURINE 7.5 09/29/2014 1008   GLUCOSEU NEGATIVE 09/29/2014 1008   HGBUR NEGATIVE 09/29/2014 1008   BILIRUBINUR NEGATIVE 09/29/2014 1008   KETONESUR NEGATIVE 09/29/2014 1008   PROTEINUR NEGATIVE 09/29/2014 1008   UROBILINOGEN 0.2 09/29/2014 1008   NITRITE NEGATIVE 09/29/2014 1008   LEUKOCYTESUR NEGATIVE 09/29/2014 1008   Sepsis Labs Invalid input(s): PROCALCITONIN,  WBC,  LACTICIDVEN Microbiology Recent Results (from the past 240 hour(s))  Resp Panel by RT-PCR (Flu A&B, Covid) Nasopharyngeal Swab     Status: None   Collection Time: 07/26/20 10:16 AM   Specimen: Nasopharyngeal Swab; Nasopharyngeal(NP) swabs in vial transport medium  Result Value Ref Range Status   SARS Coronavirus 2 by RT PCR NEGATIVE NEGATIVE Final    Comment: (NOTE) SARS-CoV-2 target nucleic acids are NOT DETECTED.  The SARS-CoV-2 RNA is generally detectable in upper respiratory specimens during the acute phase  of infection. The lowest concentration of SARS-CoV-2 viral copies this assay can detect is  138 copies/mL. A negative result does not preclude SARS-Cov-2 infection and should not be used as the sole basis for treatment or other patient management decisions. A negative result may occur with  improper specimen collection/handling, submission of specimen other than nasopharyngeal swab, presence of viral mutation(s) within the areas targeted by this assay, and inadequate number of viral copies(<138 copies/mL). A negative result must be combined with clinical observations, patient history, and epidemiological information. The expected result is Negative.  Fact Sheet for Patients:  BloggerCourse.com  Fact Sheet for Healthcare Providers:  SeriousBroker.it  This test is no t yet approved or cleared by the [place] FDA and  has been authorized for detection and/or diagnosis of SARS-CoV-2 by FDA under an Emergency Use Authorization (EUA). This EUA will remain  in effect (meaning this test can be used) for the duration of the COVID-19 declaration under Section 564(b)(1) of the Act, 21 U.S.C.section 360bbb-3(b)(1), unless the authorization is terminated  or revoked sooner.       Influenza A by PCR NEGATIVE NEGATIVE Final   Influenza B by PCR NEGATIVE NEGATIVE Final    Comment: (NOTE) The Xpert Xpress SARS-CoV-2/FLU/RSV plus assay is intended as an aid in the diagnosis of influenza from Nasopharyngeal swab specimens and should not be used as a sole basis for treatment. Nasal washings and aspirates are unacceptable for Xpert Xpress SARS-CoV-2/FLU/RSV testing.  Fact Sheet for Patients: BloggerCourse.com  Fact Sheet for Healthcare Providers: SeriousBroker.it  This test is not yet approved or cleared by the [place] FDA and has been authorized for detection and/or diagnosis of  SARS-CoV-2 by FDA under an Emergency Use Authorization (EUA). This EUA will remain in effect (meaning this test can be used) for the duration of the COVID-19 declaration under Section 564(b)(1) of the Act, 21 U.S.C. section 360bbb-3(b)(1), unless the authorization is terminated or revoked.  Performed at [facility], [address] [place], [place] [address]   Blood Culture (routine x 2)     Status: Abnormal (Preliminary result)   Collection Time: 07/26/20 11:13 AM   Specimen: BLOOD  Result Value Ref Range Status   Specimen Description BLOOD LEFT ANTECUBITAL  Final   Special Requests   Final    BOTTLES DRAWN AEROBIC AND ANAEROBIC Blood Culture results may not be optimal due to an excessive volume of blood received in culture bottles   Culture  Setup Time   Final    GRAM NEGATIVE RODS AEROBIC BOTTLE ONLY CRITICAL VALUE NOTED.  VALUE IS CONSISTENT WITH PREVIOUSLY REPORTED AND CALLED VALUE.    Culture (A)  Final    HAEMOPHILUS INFLUENZAE BETA LACTAMASE POSITIVE Performed at [facility], [address], [place], [place] [address]    Report Status PENDING  Incomplete  Blood Culture (routine x 2)     Status: Abnormal (Preliminary result)   Collection Time: 07/26/20 11:13 AM   Specimen: BLOOD LEFT HAND  Result Value Ref Range Status   Specimen Description BLOOD LEFT HAND  Final   Special Requests   Final    BOTTLES DRAWN AEROBIC AND ANAEROBIC Blood Culture adequate volume   Culture  Setup Time   Final    GRAM NEGATIVE RODS AEROBIC BOTTLE ONLY CRITICAL RESULT CALLED TO, READ BACK BY AND VERIFIED WITHMelven Sartorius PHARMD, AT 0224 07/28/20 DV    Culture (A)  Final    HAEMOPHILUS INFLUENZAE BETA LACTAMASE POSITIVE HEALTH DEPARTMENT NOTIFIED Performed at [facility], [address] [place], [place] [address]  Report Status PENDING  Incomplete  Blood Culture ID Panel (Reflexed)     Status: Abnormal   Collection Time: 07/26/20 11:13 AM  Result Value Ref Range  Status   Enterococcus faecalis NOT DETECTED NOT DETECTED Final   Enterococcus Faecium NOT DETECTED NOT DETECTED Final   Listeria monocytogenes NOT DETECTED NOT DETECTED Final   Staphylococcus species NOT DETECTED NOT DETECTED Final   Staphylococcus aureus (BCID) NOT DETECTED NOT DETECTED Final   Staphylococcus epidermidis NOT DETECTED NOT DETECTED Final   Staphylococcus lugdunensis NOT DETECTED NOT DETECTED Final   Streptococcus species NOT DETECTED NOT DETECTED Final   Streptococcus agalactiae NOT DETECTED NOT DETECTED Final   Streptococcus pneumoniae NOT DETECTED NOT DETECTED Final   Streptococcus pyogenes NOT DETECTED NOT DETECTED Final   A.calcoaceticus-baumannii NOT DETECTED NOT DETECTED Final   Bacteroides fragilis NOT DETECTED NOT DETECTED Final   Enterobacterales NOT DETECTED NOT DETECTED Final   Enterobacter cloacae complex NOT DETECTED NOT DETECTED Final   Escherichia coli NOT DETECTED NOT DETECTED Final   Klebsiella aerogenes NOT DETECTED NOT DETECTED Final   Klebsiella oxytoca NOT DETECTED NOT DETECTED Final   Klebsiella pneumoniae NOT DETECTED NOT DETECTED Final   Proteus species NOT DETECTED NOT DETECTED Final   Salmonella species NOT DETECTED NOT DETECTED Final   Serratia marcescens NOT DETECTED NOT DETECTED Final   Haemophilus influenzae DETECTED (A) NOT DETECTED Final    Comment: CRITICAL RESULT CALLED TO, READ BACK BY AND VERIFIED WITH: J. LEDFORD PHARMD, AT 0224 07/28/20 DV    Neisseria meningitidis NOT DETECTED NOT DETECTED Final   Pseudomonas aeruginosa NOT DETECTED NOT DETECTED Final   Stenotrophomonas maltophilia NOT DETECTED NOT DETECTED Final   Candida albicans NOT DETECTED NOT DETECTED Final   Candida auris NOT DETECTED NOT DETECTED Final   Candida glabrata NOT DETECTED NOT DETECTED Final   Candida krusei NOT DETECTED NOT DETECTED Final   Candida parapsilosis NOT DETECTED NOT DETECTED Final   Candida tropicalis NOT DETECTED NOT DETECTED Final    Cryptococcus neoformans/gattii NOT DETECTED NOT DETECTED Final    Comment: Performed at [facility], [address], [place], [place] [address]     Total time spend on discharging this patient, including the last patient exam, discussing the hospital stay, instructions for ongoing care as it relates to all pertinent caregivers, as well as preparing the medical discharge records, prescriptions, and/or referrals as applicable, is 35 minutes.    Darlin Priestly, Lisa Owen  [facility] 08/03/2020, 10:00 AM

## 2020-08-12 ENCOUNTER — Other Ambulatory Visit: Payer: Self-pay

## 2020-08-12 ENCOUNTER — Ambulatory Visit (INDEPENDENT_AMBULATORY_CARE_PROVIDER_SITE_OTHER): Payer: BC Managed Care – PPO

## 2020-08-12 ENCOUNTER — Encounter: Payer: Self-pay | Admitting: Internal Medicine

## 2020-08-12 ENCOUNTER — Ambulatory Visit: Payer: BC Managed Care – PPO | Admitting: Internal Medicine

## 2020-08-12 VITALS — BP 150/74 | HR 89 | Ht 64.0 in | Wt 153.0 lb

## 2020-08-12 DIAGNOSIS — J9601 Acute respiratory failure with hypoxia: Secondary | ICD-10-CM | POA: Diagnosis not present

## 2020-08-12 DIAGNOSIS — J14 Pneumonia due to Hemophilus influenzae: Secondary | ICD-10-CM

## 2020-08-12 DIAGNOSIS — R053 Chronic cough: Secondary | ICD-10-CM | POA: Diagnosis not present

## 2020-08-12 DIAGNOSIS — J9602 Acute respiratory failure with hypercapnia: Secondary | ICD-10-CM

## 2020-08-12 DIAGNOSIS — Z7722 Contact with and (suspected) exposure to environmental tobacco smoke (acute) (chronic): Secondary | ICD-10-CM

## 2020-08-12 DIAGNOSIS — Z87891 Personal history of nicotine dependence: Secondary | ICD-10-CM | POA: Diagnosis not present

## 2020-08-12 DIAGNOSIS — Y93E6 Activity, residential relocation: Secondary | ICD-10-CM

## 2020-08-12 LAB — CBC WITH DIFFERENTIAL/PLATELET
Basophils Absolute: 0.1 10*3/uL (ref 0.0–0.1)
Basophils Relative: 1.1 % (ref 0.0–3.0)
Eosinophils Absolute: 0.2 10*3/uL (ref 0.0–0.7)
Eosinophils Relative: 2.2 % (ref 0.0–5.0)
HCT: 33.1 % — ABNORMAL LOW (ref 36.0–46.0)
Hemoglobin: 11 g/dL — ABNORMAL LOW (ref 12.0–15.0)
Lymphocytes Relative: 20.8 % (ref 12.0–46.0)
Lymphs Abs: 1.6 10*3/uL (ref 0.7–4.0)
MCHC: 33.2 g/dL (ref 30.0–36.0)
MCV: 91 fl (ref 78.0–100.0)
Monocytes Absolute: 1 10*3/uL (ref 0.1–1.0)
Monocytes Relative: 13.1 % — ABNORMAL HIGH (ref 3.0–12.0)
Neutro Abs: 4.8 10*3/uL (ref 1.4–7.7)
Neutrophils Relative %: 62.8 % (ref 43.0–77.0)
Platelets: 519 10*3/uL — ABNORMAL HIGH (ref 150.0–400.0)
RBC: 3.64 Mil/uL — ABNORMAL LOW (ref 3.87–5.11)
RDW: 15.4 % (ref 11.5–15.5)
WBC: 7.7 10*3/uL (ref 4.0–10.5)

## 2020-08-12 LAB — BASIC METABOLIC PANEL
BUN: 10 mg/dL (ref 6–23)
CO2: 28 mEq/L (ref 19–32)
Calcium: 9.7 mg/dL (ref 8.4–10.5)
Chloride: 99 mEq/L (ref 96–112)
Creatinine, Ser: 0.78 mg/dL (ref 0.40–1.20)
GFR: 78.22 mL/min (ref >60.00)
Glucose, Bld: 75 mg/dL (ref 70–99)
Potassium: 4 mEq/L (ref 3.5–5.1)
Sodium: 137 mEq/L (ref 135–145)

## 2020-08-12 LAB — HEPATIC FUNCTION PANEL
ALT: 20 U/L (ref 0–35)
AST: 18 U/L (ref 0–37)
Albumin: 3.9 g/dL (ref 3.5–5.2)
Alkaline Phosphatase: 60 U/L (ref 39–117)
Bilirubin, Direct: 0.1 mg/dL (ref 0.0–0.3)
Total Bilirubin: 0.3 mg/dL (ref 0.2–1.2)
Total Protein: 7.6 g/dL (ref 6.0–8.3)

## 2020-08-12 LAB — MAGNESIUM: Magnesium: 2.1 mg/dL (ref 1.5–2.5)

## 2020-08-12 LAB — PHOSPHORUS: Phosphorus: 4 mg/dL (ref 2.3–4.6)

## 2020-08-12 MED ORDER — TRELEGY ELLIPTA 100-62.5-25 MCG/INH IN AEPB: 1.0000 | INHALATION_SPRAY | Freq: Every day | RESPIRATORY_TRACT | 5 refills | Status: DC

## 2020-08-12 NOTE — Addendum Note (Signed)
Addended by: Wyvonne Lenz on: 08/12/2020 01:16 PM   Modules accepted: Orders

## 2020-08-12 NOTE — Patient Instructions (Addendum)
ICD-10-CM   1. Acute respiratory failure with hypoxia and hypercapnia (HCC)  J96.01 DG Chest 2 View   J96.02 Pulmonary function test    CBC with Differential/Platelet    Basic metabolic panel    Magnesium    Phosphorus    Hepatic function panel    Pro b natriuretic peptide (BNP)    Pro b natriuretic peptide (BNP)    Hepatic function panel    Phosphorus    Magnesium    Basic metabolic panel    CBC with Differential/Platelet    2. Pneumonia of right lower lobe due to Haemophilus influenzae (HCC)  J14 DG Chest 2 View    Pulmonary function test    CBC with Differential/Platelet    Basic metabolic panel    Magnesium    Phosphorus    Hepatic function panel    Pro b natriuretic peptide (BNP)    Pro b natriuretic peptide (BNP)    Hepatic function panel    Phosphorus    Magnesium    Basic metabolic panel    CBC with Differential/Platelet    3. Chronic cough  R05.3     4. Smoking history  Z87.891     5. Passive smoker  Z77.22     6. Residential relocation  Y93.E6       Glad you are better  Cough is likely from undiagnosed copd though othe reasons such as lung cancer or pulmonary fibrosis need rule out  Lisinopril recently started in hospital could be playing a role in cough  Understand you are planning relocation to [facility], [place]  Plan Do cxr 2 view 08/12/2020 Do ABG next few days to several days Do cbc with diff, bmet, mag, phos, LFT, bnp 08/12/2020 Do echo next few days to few weeks Talk to PCP Blair Heys, MDand change zestril to calcium channel blocker for BP Change BREO to TRELEGy daily  USe duoneb as needed Gland no one smoking at home  Followup  - next few weeks with app to discuss results  - at followup based on result discuss timing and location of HRCT chest, PFT and rehab referral (probably for sept 2022)

## 2020-08-12 NOTE — Progress Notes (Signed)
OV 08/12/2020  Subjective:  Patient ID: Lisa Owen, female , DOB: [birth year] , age 70 y.o. , MRN: [id number] , ADDRESS: [address] [place] [address] PCP Blair Heys, MD Patient Care Team: Blair Heys, MD as PCP - General (Family Medicine)  This Provider for this visit: Treatment Team:  Attending Provider: Kalman Shan, MD    08/12/2020 -   Chief Complaint  Patient presents with   Consult    Pt was admitted to the hospital for pneumonia and is coming to see [place] for a consult. Pt is still having problems with SOB and also has a cough which she will sometimes cough up phlegm. Pt also has some complaints of wheezing and chest discomfort.     HPI Lisa Owen 70 y.o. -presents with daughter Lisa Owen.  She was a smoker in the past but then for the last 20 years her husband has been heavily smoking inside the house.  The daughter describes the house is so smoke-filled that when people into the house from outside start coughing.  Patient has had chronic cough for 1 year with congestion.  Even talking on the phone she coughs.  Most of the time at baseline the mucus is white.  Per the daughter getting medical attention for this was in the works.  Then on August 03, 2020 she got admitted with right lower lobe haemophilus influenza pneumonia.  Review of the records indicate infectious disease consult on July 28, 2020 and discharge on August 03, 2020.  At its worst she was on 7 L and on BiPAP.  At the time of discharge she was on 3 L nasal cannula.  She was on steroids at the time of the hospitalization.  Breo was added and lisinopril was added as new discharge medications.  Since discharge overall her conditioning is getting better.  Today room air at rest after 15 minutes she is 89% on room air at rest.  She says going to the doctor's office makes her more short of breath than being in the house and doing ADLs.  This is because of both exertion and anxiety associated with this.  Daughter  says she still coughing quite a bit but the cough is just the baseline cough and not worse posthospitalization.  Patient feels the last few days the cough is somewhat worse but the daughter feels it is the same.  The sputum is not as colored as it was and overall the course of the sputum is improving.  The shortness of breath and physical strength is also improving.  Visualization of the chest x-ray on 07/26/2020 shows right lower lobe infiltrates no subsequent chest x-ray labs show some anemia of hospitalization but otherwise stable.  No echo was done.  She got a full course of antibiotics.  The daughter is really concerned about underlying lung disease.  She grew H. influenzae bacteremia in the hospital  There is no baseline PFTs or CT scan The husband has since quit smoking  In August 2022 the husband and patient plan to relocate to a new house without any smoke in [place], [place]   has a past medical history of Allergy, Arrhythmia, Arthritis, Carotid bruit, hysterectomy, Hypertension, Osteopenia, and Thyroid disease.   reports that she quit smoking about 20 years ago. Her smoking use included cigarettes. She started smoking about 52 years ago. She has a 16.00 pack-year smoking history. She has been exposed to tobacco smoke. She has never used smokeless  tobacco.  Past Surgical History:  Procedure Laterality Date   BREAST BIOPSY Left 04/2012   Core     Allergies  Allergen Reactions   Boniva [Ibandronic Acid]     Aches and GI upset   Clarinex [Desloratadine]    Fosamax [Alendronate Sodium]    Sudafed [Pseudoephedrine]     Hypertension Tachycardia      There is no immunization history on file for this patient.  Family History  Problem Relation Age of Onset   Arthritis Mother    Lung cancer Father    CVA Sister    Breast cancer Maternal Aunt      Current Outpatient Medications:    albuterol (VENTOLIN HFA) 108 (90 Base) MCG/ACT inhaler, Inhale 2 puffs into the lungs  every 6 (six) hours as needed for wheezing or shortness of breath., Disp: 8 g, Rfl: 2   amLODipine (NORVASC) 10 MG tablet, Take 1 tablet (10 mg total) by mouth daily. An increase from 5 mg., Disp: 30 tablet, Rfl: 2   aspirin EC 81 MG tablet, Take 81 mg by mouth daily., Disp: , Rfl:    escitalopram (LEXAPRO) 20 MG tablet, Take 20 mg by mouth daily., Disp: , Rfl:    Fluticasone-Umeclidin-Vilant (TRELEGY ELLIPTA) 100-62.5-25 MCG/INH AEPB, Inhale 1 puff into the lungs daily., Disp: 60 each, Rfl: 5   ipratropium-albuterol (DUONEB) 0.5-2.5 (3) MG/3ML SOLN, Take 3 mLs by nebulization every 6 (six) hours as needed., Disp: 75 mL, Rfl: 2   lisinopril (ZESTRIL) 20 MG tablet, Take 1 tablet (20 mg total) by mouth daily. New blood pressure medication., Disp: 90 tablet, Rfl: 0   montelukast (SINGULAIR) 10 MG tablet, Take 10 mg by mouth every evening., Disp: , Rfl:    Multiple Vitamins-Minerals (CENTRUM SILVER PO), Take 1 tablet by mouth daily., Disp: , Rfl:    vitamin C (ASCORBIC ACID) 500 MG tablet, Take 500 mg by mouth daily., Disp: , Rfl:    VITAMIN E PO, Take 1 capsule by mouth daily., Disp: , Rfl:       Objective:   Vitals:   08/12/20 1121  BP: (!) 150/74  Pulse: 89  SpO2: 97%  Weight: 153 lb (69.4 kg)  Height: 5\' 4"  (1.626 m)    Estimated body mass index is 26.26 kg/m as calculated from the following:   Height as of this encounter: 5\' 4"  (1.626 m).   Weight as of this encounter: 153 lb (69.4 kg).  @WEIGHTCHANGE @  [employer]   08/12/20 1121  Weight: 153 lb (69.4 kg)     Physical Exam  General Appearance:    Alert, cooperative, no distress, appears stated age - yes , Deconditioned looking - yes , OBESE  - no, Sitting on Wheelchair -  no  Head:    Normocephalic, without obvious abnormality, atraumatic  Eyes:    PERRL, conjunctiva/corneas clear,  Ears:    Normal TM's and external ear canals, both ears  Nose:   Nares normal, septum midline, mucosa normal, no drainage    or sinus  tenderness. OXYGEN ON  - yes . Patient is @ [facility]   Throat:   Lips, mucosa, and tongue normal; teeth and gums normal. Cyanosis on lips - no  Neck:   Supple, symmetrical, trachea midline, no adenopathy;    thyroid:  no enlargement/tenderness/nodules; no carotid   bruit or JVD  Back:     Symmetric, no curvature, ROM normal, no CVA tenderness  Lungs:     Distress - no , Wheeze yes  mild scattered, Barrell Chest - yes possibily, Purse lip breathing - no, Crackles - no   Chest Wall:    No tenderness or deformity.    Heart:    Regular rate and rhythm, S1 and S2 normal, no rub   or gallop, Murmur - no  Breast Exam:    NOT DONE  Abdomen:     Soft, non-tender, bowel sounds active all four quadrants,    no masses, no organomegaly. Visceral obesity - no  Genitalia:   NOT DONE  Rectal:   NOT DONE  Extremities:   Extremities - normal, Has Cane - no, Clubbing - no, Edema - no  Pulses:   2+ and symmetric all extremities  Skin:   Stigmata of Connective Tissue Disease - no  Lymph nodes:   Cervical, supraclavicular, and axillary nodes normal  Psychiatric:  Neurologic:   Pleasant - yes, Anxious - yes, Flat affect - yes  CAm-ICU - neg, Alert and Oriented x 3 - yes, Moves all 4s - yes, Speech - normal, Cognition - intact       Assessment:       ICD-10-CM   1. Acute respiratory failure with hypoxia and hypercapnia (HCC)  J96.01 DG Chest 2 View   J96.02 Pulmonary function test    CBC with Differential/Platelet    Basic metabolic panel    Magnesium    Phosphorus    Hepatic function panel    Pro b natriuretic peptide (BNP)    Pro b natriuretic peptide (BNP)    Hepatic function panel    Phosphorus    Magnesium    Basic metabolic panel    CBC with Differential/Platelet    ECHOCARDIOGRAM COMPLETE    2. Pneumonia of right lower lobe due to Haemophilus influenzae (HCC)  J14 DG Chest 2 View    Pulmonary function test    CBC with Differential/Platelet    Basic metabolic panel    Magnesium     Phosphorus    Hepatic function panel    Pro b natriuretic peptide (BNP)    Pro b natriuretic peptide (BNP)    Hepatic function panel    Phosphorus    Magnesium    Basic metabolic panel    CBC with Differential/Platelet    ECHOCARDIOGRAM COMPLETE    3. Chronic cough  R05.3 ECHOCARDIOGRAM COMPLETE    4. Smoking history  Z87.891     5. Passive smoker  Z77.22     6. Residential relocation  Y93.E6          Plan:     Patient Instructions     ICD-10-CM   1. Acute respiratory failure with hypoxia and hypercapnia (HCC)  J96.01 DG Chest 2 View   J96.02 Pulmonary function test    CBC with Differential/Platelet    Basic metabolic panel    Magnesium    Phosphorus    Hepatic function panel    Pro b natriuretic peptide (BNP)    Pro b natriuretic peptide (BNP)    Hepatic function panel    Phosphorus    Magnesium    Basic metabolic panel    CBC with Differential/Platelet    2. Pneumonia of right lower lobe due to Haemophilus influenzae (HCC)  J14 DG Chest 2 View    Pulmonary function test    CBC with Differential/Platelet    Basic metabolic panel    Magnesium    Phosphorus    Hepatic function panel    Pro b natriuretic peptide (BNP)  Pro b natriuretic peptide (BNP)    Hepatic function panel    Phosphorus    Magnesium    Basic metabolic panel    CBC with Differential/Platelet    3. Chronic cough  R05.3     4. Smoking history  Z87.891     5. Passive smoker  Z77.22     6. Residential relocation  Y93.E6       Glad you are better  Cough is likely from undiagnosed copd though othe reasons such as lung cancer or pulmonary fibrosis need rule out  Lisinopril recently started in hospital could be playing a role in cough  Understand you are planning relocation to [facility], [place]  Plan Do cxr 2 view 08/12/2020 Do ABG next few days to several days Do cbc with diff, bmet, mag, phos, LFT, bnp 08/12/2020 Do echo next few days to few weeks Talk to PCP Blair Heys, MDand  change zestril to calcium channel blocker for BP Change BREO to TRELEGy daily  USe duoneb as needed Gland no one smoking at home  Followup  - next few weeks with app to discuss results  - at followup based on result discuss timing and location of HRCT chest, PFT and rehab referral (probably for sept 2022)    SIGNATURE    Dr. Kalman Shan, M.D., F.C.C.P,  Pulmonary and Critical Care Medicine Staff Physician, [facility] Center Director - Interstitial Lung Disease  Program  Pulmonary Fibrosis [facility] Network at [facility] [place], [place], [address]  Pager: [phone], If no answer or between  15:00h - 7:00h: call [phone] Telephone: [phone]  12:29 PM 08/12/2020

## 2020-08-13 ENCOUNTER — Other Ambulatory Visit: Payer: Self-pay | Admitting: *Deleted

## 2020-08-13 ENCOUNTER — Telehealth: Payer: Self-pay | Admitting: Internal Medicine

## 2020-08-13 LAB — PRO B NATRIURETIC PEPTIDE: NT-Pro BNP: 404 pg/mL — ABNORMAL HIGH (ref 0–301)

## 2020-08-13 MED ORDER — POTASSIUM CHLORIDE CRYS ER 20 MEQ PO TBCR: 20.0000 meq | EXTENDED_RELEASE_TABLET | Freq: Every day | ORAL | 5 refills | Status: DC

## 2020-08-13 MED ORDER — FUROSEMIDE 20 MG PO TABS: 20.0000 mg | ORAL_TABLET | Freq: Every day | ORAL | 5 refills | Status: DC

## 2020-08-13 NOTE — Telephone Encounter (Signed)
  LAb review   - Pneumonia improved on cxr  - anemia - improving  - Kidneys and chemistry - normal  - OVerall better BUT  - BNP  high -> concerning for chf  Plan  - when is echo? Can it be done next week and see APP next week? - if not start lasix 20mg  with KCL daily (needs to stop lisinopril before starting KCL - needs to tstop it due to cough)    LABS    PULMONARY No results for input(s): PHART, PCO2ART, PO2ART, HCO3, TCO2, O2SAT in the last 168 hours.  Invalid input(s): PCO2, PO2  CBC Recent Labs  Lab 08/12/20 1227  HGB 11.0*  HCT 33.1*  WBC 7.7  PLT 519.0*    COAGULATION No results for input(s): INR in the last 168 hours.  CARDIAC  No results for input(s): TROPONINI in the last 168 hours. Recent Labs  Lab 08/12/20 1227  PROBNP 404*     CHEMISTRY Recent Labs  Lab 08/12/20 1227  NA 137  K 4.0  CL 99  CO2 28  GLUCOSE 75  BUN 10  CREATININE 0.78  CALCIUM 9.7  MG 2.1  PHOS 4.0   Estimated Creatinine Clearance: 64.4 mL/min (by C-G formula based on SCr of 0.78 mg/dL).   LIVER Recent Labs  Lab 08/12/20 1227  AST 18  ALT 20  ALKPHOS 60  BILITOT 0.3  PROT 7.6  ALBUMIN 3.9     INFECTIOUS No results for input(s): LATICACIDVEN, PROCALCITON in the last 168 hours.   ENDOCRINE CBG (last 3)  No results for input(s): GLUCAP in the last 72 hours.       IMAGING x48h  - image(s) personally visualized  -   highlighted in bold DG Chest 2 View  Result Date: 08/12/2020 CLINICAL DATA:  Follow-up pneumonia EXAM: CHEST - 2 VIEW COMPARISON:  07/26/2020 FINDINGS: Clearing of previously noted right lung base pneumonia. There may be trace right pleural effusion. Normal cardiomediastinal silhouette with aortic atherosclerosis. No pneumothorax. IMPRESSION: Small right pleural effusion.  Clearing of right lung base pneumonia Electronically Signed   By: 07/28/2020 M.D.   On: 08/12/2020 23:36

## 2020-08-13 NOTE — Telephone Encounter (Signed)
Pt calling because she has called [employer] and they have stated they don't have any oxygen for her until Monday. Pt states she has 1000 units to last the weekend in a travel tank and that's it. Pt worried if the power goes out, she won't have any oxygen. Please advise [phone]

## 2020-08-13 NOTE — Addendum Note (Signed)
Addended by: Wyvonne Lenz on: 08/13/2020 01:32 PM   Modules accepted: Orders

## 2020-08-13 NOTE — Telephone Encounter (Signed)
Called and spoke with pt about her oxygen. Pt said that she was told by [employer] that they will not be able to come out to deliver her more tanks until Monday and is concerned due to the fact that she only has one tank left other than her home concentrator and that one tank only has 1000 in it.   Stated to pt to call [employer] as since she is on oxygen, they will set up an account for her if for any reason the power were to go out, she would be first priority to get the power cut back on since she is on oxygen. Pt verbalized understanding and stated she would call [employer]. Nothing further needed.

## 2020-08-13 NOTE — Telephone Encounter (Signed)
Echo has not been scheduled yet for pt.  Pt currently has a follow up scheduled with TP 8/4 which is first avail opening.  Called and spoke with pt letting her know the results of the labwork and cxr per MR and stated to her that MR wants her to start taking lasix and KCL due to concerns for CHF. Pt verbalized understanding. Pt said that she contacted PCP after OV with our office 7/7 and was told by PCP to stop taking the lisinopril so I have removed med off of pt's med list.  Lasix and KCl have been sent to pharmacy for pt.  PCCs, please advise in regards to getting echo scheduled for pt.

## 2020-08-13 NOTE — Telephone Encounter (Signed)
disregard

## 2020-08-13 NOTE — Telephone Encounter (Signed)
LMTCB

## 2020-08-13 NOTE — Telephone Encounter (Signed)
Echo was just ordered yesterday and order has been sent to [facility] to get precert before can be scheduled.

## 2020-08-24 ENCOUNTER — Telehealth: Payer: Self-pay | Admitting: Internal Medicine

## 2020-08-24 LAB — CULTURE, BLOOD (ROUTINE X 2)

## 2020-08-24 NOTE — Telephone Encounter (Signed)
I have called Lisa Owen from [place].  I advised him that in her chart it is documented that she use the oxygen 2 liters continuous.  I advised Fayrene Fearing to call back if needed.

## 2020-08-25 ENCOUNTER — Other Ambulatory Visit: Payer: Self-pay

## 2020-08-25 ENCOUNTER — Ambulatory Visit (HOSPITAL_COMMUNITY)
Admission: RE | Admit: 2020-08-25 | Discharge: 2020-08-25 | Disposition: A | Discharge status: Home / Self Care | Payer: BC Managed Care – PPO | Source: Ambulatory Visit | Attending: Internal Medicine | Admitting: Internal Medicine

## 2020-08-25 DIAGNOSIS — I700 Atherosclerosis of aorta: Secondary | ICD-10-CM | POA: Insufficient documentation

## 2020-08-25 DIAGNOSIS — I10 Essential (primary) hypertension: Secondary | ICD-10-CM | POA: Insufficient documentation

## 2020-08-25 DIAGNOSIS — R0600 Dyspnea, unspecified: Secondary | ICD-10-CM | POA: Insufficient documentation

## 2020-08-25 DIAGNOSIS — J14 Pneumonia due to Hemophilus influenzae: Secondary | ICD-10-CM

## 2020-08-25 DIAGNOSIS — J9602 Acute respiratory failure with hypercapnia: Secondary | ICD-10-CM

## 2020-08-25 DIAGNOSIS — R053 Chronic cough: Secondary | ICD-10-CM | POA: Diagnosis not present

## 2020-08-25 DIAGNOSIS — J9601 Acute respiratory failure with hypoxia: Secondary | ICD-10-CM

## 2020-08-25 LAB — ECHOCARDIOGRAM COMPLETE
Area-P 1/2: 3.37 cm2
S' Lateral: 3.1 cm

## 2020-09-09 ENCOUNTER — Other Ambulatory Visit: Payer: Self-pay

## 2020-09-09 ENCOUNTER — Encounter: Payer: Self-pay | Admitting: Adult Health

## 2020-09-09 ENCOUNTER — Ambulatory Visit: Payer: BC Managed Care – PPO | Admitting: Adult Health

## 2020-09-09 ENCOUNTER — Telehealth: Payer: Self-pay | Admitting: Adult Health

## 2020-09-09 VITALS — BP 140/68 | HR 89 | Temp 97.3°F | Ht 64.0 in | Wt 154.2 lb

## 2020-09-09 DIAGNOSIS — R0902 Hypoxemia: Secondary | ICD-10-CM

## 2020-09-09 DIAGNOSIS — J14 Pneumonia due to Hemophilus influenzae: Secondary | ICD-10-CM | POA: Diagnosis not present

## 2020-09-09 DIAGNOSIS — I10 Essential (primary) hypertension: Secondary | ICD-10-CM | POA: Insufficient documentation

## 2020-09-09 DIAGNOSIS — R0602 Shortness of breath: Secondary | ICD-10-CM

## 2020-09-09 DIAGNOSIS — J9602 Acute respiratory failure with hypercapnia: Secondary | ICD-10-CM

## 2020-09-09 DIAGNOSIS — R053 Chronic cough: Secondary | ICD-10-CM | POA: Diagnosis not present

## 2020-09-09 DIAGNOSIS — J9601 Acute respiratory failure with hypoxia: Secondary | ICD-10-CM | POA: Diagnosis not present

## 2020-09-09 NOTE — Progress Notes (Signed)
@Patient  ID: , female    DOB: [birth year], 70 y.o.   MRN: [id number]  Chief Complaint  Patient presents with   Follow-up    Referring provider: [id number], MD  HPI: 70 year old female former smoker seen for pulmonary consult August 12, 2020 for ongoing shortness of breath after pneumonia.  Patient was admitted August 03, 2020 with right lower lobe haemophilus influenza pneumonia, haemophilus influenza bacteremia and acute hypoxic respiratory failure  TEST/EVENTS :   09/09/2020 Follow up : Pneumonia , Dyspnea  Patient returns for a 1 month follow-up.  She was seen last visit for a pulmonary consult after recent hospitalization for haemophilus influenza pneumonia and bacteremia. Patient was admitted late June 2022 with a right lower lobe haemophilus influenza pneumonia and haemophilus influenza bacteremia.  She had acute respiratory failure and required oxygen therapy. 2D echo completed on August 25, 2020 showed EF at 60-65%, grade 1 diastolic dysfunction, right ventricular systolic function normal, right ventricular size is normal.  Last visit she was recommended to change from [facility] to Trelegy.  She does feel that this inhaler is working well.  She was also changed off of her ACE inhibitor.  Continued on Norvasc.  Her cough has improved.  Since last visit patient says she is starting to feel better more like herself.  She is having decreased shortness of breath and improved activity tolerance.  Labs last visit showed improved anemia, mildly elevated proBNP at 404, normal magnesium and phosphorus She does have some sinus drainage.Prior to admission was diagnosed with chronic bronchitis , was on Advair .  She says about a year prior to admission she was starting to have more more breathing problems.  She was set up for pulmonary function testing unfortunately this was not completed.  She declines COVID-vaccine.  Patient education was given.  She is getting ready to move at the end of the  month to [facility].  She was also discharged on oxygen.  She says that she has weaned herself off of this.  Today in the office walk test shows O2 saturations 96% walking on room air without desaturations.   Allergies  Allergen Reactions   Boniva [Ibandronic Acid]     Aches and GI upset   Clarinex [Desloratadine]    Fosamax [Alendronate Sodium]    Sudafed [Pseudoephedrine]     Hypertension Tachycardia      There is no immunization history on file for this patient.  Past Medical History:  Diagnosis Date   Allergy    Arrhythmia    tacycardia    Arthritis    (Thumb CMCs bil)   Carotid bruit    doppler normal 09/2009   Hx of hysterectomy    secondary endometriosis/ left oorphorectomy secondary endometriosis   Hypertension    Osteopenia    Thyroid disease    hypothyroidism    Tobacco History: Social History   Tobacco Use  Smoking Status Former   Packs/day: 0.50   Years: 32.00   Pack years: 16.00   Types: Cigarettes   Start date: 82   Quit date: 2002   Years since quitting: 20.6   Passive exposure: Past  Smokeless Tobacco Never   Counseling given: Not Answered   Outpatient Medications Prior to Visit  Medication Sig Dispense Refill   albuterol (VENTOLIN HFA) 108 (90 Base) MCG/ACT inhaler Inhale 2 puffs into the lungs every 6 (six) hours as needed for wheezing or shortness of breath. 8 g 2   amLODipine (NORVASC) 10 MG  tablet Take 1 tablet (10 mg total) by mouth daily. An increase from 5 mg. 30 tablet 2   aspirin EC 81 MG tablet Take 81 mg by mouth daily.     escitalopram (LEXAPRO) 20 MG tablet Take 20 mg by mouth daily.     Fluticasone-Umeclidin-Vilant (TRELEGY ELLIPTA) 100-62.5-25 MCG/INH AEPB Inhale 1 puff into the lungs daily. 60 each 5   furosemide (LASIX) 20 MG tablet Take 1 tablet (20 mg total) by mouth daily. 30 tablet 5   ipratropium-albuterol (DUONEB) 0.5-2.5 (3) MG/3ML SOLN Take 3 mLs by nebulization every 6 (six) hours as needed. 75 mL 2    montelukast (SINGULAIR) 10 MG tablet Take 10 mg by mouth every evening.     Multiple Vitamins-Minerals (CENTRUM SILVER PO) Take 1 tablet by mouth daily.     potassium chloride SA (KLOR-CON) 20 MEQ tablet Take 1 tablet (20 mEq total) by mouth daily. 30 tablet 5   vitamin C (ASCORBIC ACID) 500 MG tablet Take 500 mg by mouth daily.     VITAMIN E PO Take 1 capsule by mouth daily.     ADVAIR DISKUS 100-50 MCG/ACT AEPB 1 puff 2 (two) times daily.     No facility-administered medications prior to visit.     Review of Systems:   Constitutional:   No  weight loss, night sweats,  Fevers, chills,  +fatigue, or  lassitude.  HEENT:   No headaches,  Difficulty swallowing,  Tooth/dental problems, or  Sore throat,                No sneezing, itching, ear ache,  +nasal congestion, post nasal drip,   CV:  No chest pain,  Orthopnea, PND, swelling in lower extremities, anasarca, dizziness, palpitations, syncope.   GI  No heartburn, indigestion, abdominal pain, nausea, vomiting, diarrhea, change in bowel habits, loss of appetite, bloody stools.   Resp: .  No chest wall deformity  Skin: no rash or lesions.  GU: no dysuria, change in color of urine, no urgency or frequency.  No flank pain, no hematuria   MS:  No joint pain or swelling.  No decreased range of motion.  No back pain.    Physical Exam  BP 140/68 (BP Location: Left Arm, Patient Position: Sitting, Cuff Size: Normal)   Pulse 89   Temp (!) 97.3 F (36.3 C) (Oral)   Ht  (1.626 m)   Wt 154 lb 3.2 oz (69.9 kg)   SpO2 95%   BMI 26.47 kg/m   GEN: A/Ox3; pleasant , NAD, well nourished    HEENT:  /AT,   NOSE-clear, THROAT-clear, no lesions, no postnasal drip or exudate noted.   NECK:  Supple w/ fair ROM; no JVD; normal carotid impulses w/o bruits; no thyromegaly or nodules palpated; no lymphadenopathy.    RESP  Clear  P & A; w/o, wheezes/ rales/ or rhonchi. no accessory muscle use, no dullness to percussion  CARD:  RRR, no  m/r/g, no peripheral edema, pulses intact, no cyanosis or clubbing.  GI:   Soft & nt; nml bowel sounds; no organomegaly or masses detected.   Musco: Warm bil, no deformities or joint swelling noted.   Neuro: alert, no focal deficits noted.    Skin: Warm, no lesions or rashes    Lab Results:  CBC    Component Value Date/Time   WBC 7.7 08/12/2020 1227   RBC 3.64 (L) 08/12/2020 1227   HGB 11.0 (L) 08/12/2020 1227   HCT 33.1 (L) 08/12/2020 1227  PLT 519.0 (H) 08/12/2020 1227   MCV 91.0 08/12/2020 1227   MCH 30.3 08/03/2020 0313   MCHC 33.2 08/12/2020 1227   RDW 15.4 08/12/2020 1227   LYMPHSABS 1.6 08/12/2020 1227   MONOABS 1.0 08/12/2020 1227   EOSABS 0.2 08/12/2020 1227   BASOSABS 0.1 08/12/2020 1227    BMET    Component Value Date/Time   NA 137 08/12/2020 1227   K 4.0 08/12/2020 1227   CL 99 08/12/2020 1227   CO2 28 08/12/2020 1227   GLUCOSE 75 08/12/2020 1227   BUN 10 08/12/2020 1227   CREATININE 0.78 08/12/2020 1227   CALCIUM 9.7 08/12/2020 1227   GFRNONAA >60 08/03/2020 0313   GFRAA >60 09/29/2014 0820    BNP    Component Value Date/Time   BNP 338.8 (H) 07/26/2020 1058    ProBNP    Component Value Date/Time   PROBNP 404 (H) 08/12/2020 1227    Imaging: DG Chest 2 View  Result Date: 08/12/2020 CLINICAL DATA:  Follow-up pneumonia EXAM: CHEST - 2 VIEW COMPARISON:  07/26/2020 FINDINGS: Clearing of previously noted right lung base pneumonia. There may be trace right pleural effusion. Normal cardiomediastinal silhouette with aortic atherosclerosis. No pneumothorax. IMPRESSION: Small right pleural effusion.  Clearing of right lung base pneumonia Electronically Signed   By: Jasmine Pang M.D.   On: 08/12/2020 23:36   ECHOCARDIOGRAM COMPLETE  Result Date: 08/25/2020    ECHOCARDIOGRAM REPORT   Patient Name:   Lisa Owen Date of Exam: 08/25/2020 Medical Rec #:  [id number]    Height:       64.0 in Accession #:    [id number]   Weight:       153.0 lb Date of Birth:   02-25-1950     BSA:          1.746 m Patient Age:    70 years     BP:           150/74 mmHg Patient Gender: F            HR:           85 bpm. Exam Location:  Outpatient Procedure: 2D Echo and 3D Echo Indications:    Dyspnea R06.00  History:        Patient has no prior history of Echocardiogram examinations.                 Risk Factors:Hypertension.  Sonographer:    Thurman Coyer RDCS (AE) Referring Phys: [id number] MURALI RAMASWAMY IMPRESSIONS  1. Left ventricular ejection fraction, by estimation, is 60 to 65%. The left ventricle has normal function. The left ventricle has no regional wall motion abnormalities. Left ventricular diastolic parameters are consistent with Grade I diastolic dysfunction (impaired relaxation).  2. Right ventricular systolic function is normal. The right ventricular size is normal.  3. The mitral valve is normal in structure. No evidence of mitral valve regurgitation. No evidence of mitral stenosis.  4. The aortic valve is tricuspid. There is mild calcification of the aortic valve. There is mild thickening of the aortic valve. Aortic valve regurgitation is not visualized. No aortic stenosis is present.  5. The inferior vena cava is normal in size with greater than 50% respiratory variability, suggesting right atrial pressure of 3 mmHg. FINDINGS  Left Ventricle: Left ventricular ejection fraction, by estimation, is 60 to 65%. The left ventricle has normal function. The left ventricle has no regional wall motion abnormalities. The left ventricular internal cavity size was normal in  size. There is  no left ventricular hypertrophy. Left ventricular diastolic parameters are consistent with Grade I diastolic dysfunction (impaired relaxation). Right Ventricle: The right ventricular size is normal. No increase in right ventricular wall thickness. Right ventricular systolic function is normal. Left Atrium: Left atrial size was normal in size. Right Atrium: Right atrial size was normal in size.  Pericardium: There is no evidence of pericardial effusion. Mitral Valve: The mitral valve is normal in structure. No evidence of mitral valve regurgitation. No evidence of mitral valve stenosis. Tricuspid Valve: The tricuspid valve is normal in structure. Tricuspid valve regurgitation is not demonstrated. No evidence of tricuspid stenosis. Aortic Valve: The aortic valve is tricuspid. There is mild calcification of the aortic valve. There is mild thickening of the aortic valve. Aortic valve regurgitation is not visualized. No aortic stenosis is present. Pulmonic Valve: The pulmonic valve was normal in structure. Pulmonic valve regurgitation is not visualized. No evidence of pulmonic stenosis. Aorta: The aortic root is normal in size and structure. Venous: The inferior vena cava is normal in size with greater than 50% respiratory variability, suggesting right atrial pressure of 3 mmHg. IAS/Shunts: No atrial level shunt detected by color flow Doppler.  LEFT VENTRICLE PLAX 2D LVIDd:         4.90 cm  Diastology LVIDs:         3.10 cm  LV e' medial:    5.87 cm/s LV PW:         0.90 cm  LV E/e' medial:  14.1 LV IVS:        0.80 cm  LV e' lateral:   6.09 cm/s LVOT diam:     2.40 cm  LV E/e' lateral: 13.6 LV SV:         83 LV SV Index:   47 LVOT Area:     4.52 cm  RIGHT VENTRICLE RV S prime:     19.10 cm/s TAPSE (M-mode): 2.3 cm LEFT ATRIUM             Index       RIGHT ATRIUM           Index LA diam:        3.20 cm 1.83 cm/m  RA Area:     11.00 cm LA Vol (A2C):   39.7 ml 22.74 ml/m RA Volume:   21.10 ml  12.09 ml/m LA Vol (A4C):   33.8 ml 19.36 ml/m LA Biplane Vol: 36.6 ml 20.96 ml/m  AORTIC VALVE LVOT Vmax:   103.00 cm/s LVOT Vmean:  66.900 cm/s LVOT VTI:    0.183 m  AORTA Ao Root diam: 3.10 cm MITRAL VALVE MV Area (PHT): 3.37 cm    SHUNTS MV Decel Time: 225 msec    Systemic VTI:  0.18 m MV E velocity: 82.70 cm/s  Systemic Diam: 2.40 cm MV A velocity: 86.20 cm/s MV E/A ratio:  0.96 Donato Schultz MD Electronically  signed by Donato Schultz MD Signature Date/Time: 08/25/2020/2:50:19 PM    Final       No flowsheet data found.  No results found for: NITRICOXIDE      Assessment & Plan:   PNA (pneumonia) Hospitalization June 2022 with haemophilus influenza pneumonia with right lower lobe airspace disease.  Follow-up chest x-ray showed clearance of right lower lobe pneumonia.  Clinically she is starting to improve.  Plan  Patient Instructions  Follow up with Primary MD regarding your blood pressure management .  Establish with Primary MD and Pulmonary when you  move.  Continue on TRELEGY 1 puff daily  Return for PFT  Check Overnight oximetry test on room air.  Follow up with our office As needed   Please contact office for sooner follow up if symptoms do not improve or worsen or seek emergency care       Hypoxia Chronic respiratory failure with hypoxia.  Patient is improving.  O2 saturations are normal on walk test today in the office.  We will check an overnight oximetry test if this is okay then will discontinue oxygen.  SOB (shortness of breath) Patient carries a diagnosis of chronic bronchitis.  She is a former smoker and has secondhand smoke exposure.  We will check PFTs to rule out underlying pulmonary process such as COPD or asthma. Continue on Trelegy inhaler daily.  Plan Patient Instructions  Follow up with Primary MD regarding your blood pressure management .  Establish with Primary MD and Pulmonary when you move.  Continue on TRELEGY 1 puff daily  Return for PFT  Check Overnight oximetry test on room air.  Follow up with our office As needed   Please contact office for sooner follow up if symptoms do not improve or worsen or seek emergency care       Hypertension Continue on current regimen.  Blood pressure is well controlled.  Continue follow-up with primary care provider.  Would avoid ACE inhibitor's going forward if possible.     Lisa Oaksammy Landry Kamath, NP 09/09/2020

## 2020-09-09 NOTE — Assessment & Plan Note (Signed)
Chronic respiratory failure with hypoxia.  Patient is improving.  O2 saturations are normal on walk test today in the office.  We will check an overnight oximetry test if this is okay then will discontinue oxygen.

## 2020-09-09 NOTE — Telephone Encounter (Signed)
Order was not placed in chart for procedure she is having (PFT)   Atc patient phone connection bad Will call again in the morning Order placed should be able to get covid test.

## 2020-09-09 NOTE — Patient Instructions (Signed)
Follow up with Primary MD regarding your blood pressure management .  Establish with Primary MD and Pulmonary when you move.  Continue on TRELEGY 1 puff daily  Return for PFT  Check Overnight oximetry test on room air.  Follow up with our office As needed   Please contact office for sooner follow up if symptoms do not improve or worsen or seek emergency care

## 2020-09-09 NOTE — Assessment & Plan Note (Signed)
Patient carries a diagnosis of chronic bronchitis.  She is a former smoker and has secondhand smoke exposure.  We will check PFTs to rule out underlying pulmonary process such as COPD or asthma. Continue on Trelegy inhaler daily.  Plan Patient Instructions  Follow up with Primary MD regarding your blood pressure management .  Establish with Primary MD and Pulmonary when you move.  Continue on TRELEGY 1 puff daily  Return for PFT  Check Overnight oximetry test on room air.  Follow up with our office As needed   Please contact office for sooner follow up if symptoms do not improve or worsen or seek emergency care

## 2020-09-09 NOTE — Assessment & Plan Note (Signed)
Continue on current regimen.  Blood pressure is well controlled.  Continue follow-up with primary care provider.  Would avoid ACE inhibitor's going forward if possible.

## 2020-09-09 NOTE — Assessment & Plan Note (Signed)
Hospitalization June 2022 with haemophilus influenza pneumonia with right lower lobe airspace disease.  Follow-up chest x-ray showed clearance of right lower lobe pneumonia.  Clinically she is starting to improve.  Plan  Patient Instructions  Follow up with Primary MD regarding your blood pressure management .  Establish with Primary MD and Pulmonary when you move.  Continue on TRELEGY 1 puff daily  Return for PFT  Check Overnight oximetry test on room air.  Follow up with our office As needed   Please contact office for sooner follow up if symptoms do not improve or worsen or seek emergency care

## 2020-09-10 NOTE — Telephone Encounter (Signed)
Called spoke with patient.  Let her know to go on 09/13/20 from 8-3 and get tested for PFT on 09/15/20 Patient voiced understanding nothing further needed at this time.

## 2020-09-13 ENCOUNTER — Other Ambulatory Visit: Payer: Self-pay | Admitting: Adult Health

## 2020-09-13 LAB — SARS CORONAVIRUS 2 (TAT 6-24 HRS): SARS Coronavirus 2: NEGATIVE

## 2020-09-15 ENCOUNTER — Other Ambulatory Visit: Payer: Self-pay

## 2020-09-15 ENCOUNTER — Ambulatory Visit: Payer: BC Managed Care – PPO | Admitting: Internal Medicine

## 2020-09-15 DIAGNOSIS — R0602 Shortness of breath: Secondary | ICD-10-CM | POA: Diagnosis not present

## 2020-09-15 NOTE — Progress Notes (Signed)
PFT done today. 

## 2020-09-17 ENCOUNTER — Telehealth: Payer: Self-pay | Admitting: Internal Medicine

## 2020-09-17 DIAGNOSIS — R0602 Shortness of breath: Secondary | ICD-10-CM

## 2020-09-17 NOTE — Telephone Encounter (Signed)
Spoke with pt and notified PFT had not been resulted yet. Dr. Marchelle Gearing will you please interpret results?

## 2020-09-20 NOTE — Telephone Encounter (Signed)
Called and spoke with pt letting her know info stated by MR and she verbalized understanding. Order for HRCT placed and f/u appt scheduled for pt. Nothing further needed.

## 2020-09-20 NOTE — Telephone Encounter (Signed)
  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  Plan - contnue trelegy  - AFter 10/26/20 she should have HRCT supine/prone (indc dyspnea, hx of pneumonia , hx of smoking, and hx of copd) and reutrn for followup  PFT Results Latest Ref Rng & Units 09/15/2020  FVC-Predicted Pre % 71  FVC-Post L 2.11  FVC-Predicted Post % 73  Pre FEV1/FVC % % 56  Post FEV1/FCV % % 58  FEV1-Pre L 1.16  FEV1-Predicted Pre % 53  FEV1-Post L 1.23  DLCO uncorrected ml/min/mmHg 10.94  DLCO UNC% % 58  DLCO corrected ml/min/mmHg 11.93  DLCO COR %Predicted % 64  DLVA Predicted % 76  TLC L 5.47  TLC % Predicted % 113  RV % Predicted % 159    reports that she quit smoking about 20 years ago. Her smoking use included cigarettes. She started smoking about 52 years ago. She has a 16.00 pack-year smoking history. She has been exposed to tobacco smoke. She has never used smokeless tobacco.    Current Outpatient Medications:    albuterol (VENTOLIN HFA) 108 (90 Base) MCG/ACT inhaler, Inhale 2 puffs into the lungs every 6 (six) hours as needed for wheezing or shortness of breath., Disp: 8 g, Rfl: 2   amLODipine (NORVASC) 10 MG tablet, Take 1 tablet (10 mg total) by mouth daily. An increase from 5 mg., Disp: 30 tablet, Rfl: 2   aspirin EC 81 MG tablet, Take 81 mg by mouth daily., Disp: , Rfl:    escitalopram (LEXAPRO) 20 MG tablet, Take 20 mg by mouth daily., Disp: , Rfl:    Fluticasone-Umeclidin-Vilant (TRELEGY ELLIPTA) 100-62.5-25 MCG/INH AEPB, Inhale 1 puff into the lungs daily., Disp: 60 each, Rfl: 5   furosemide (LASIX) 20 MG tablet, Take 1 tablet (20 mg total) by mouth daily., Disp: 30 tablet, Rfl: 5   ipratropium-albuterol (DUONEB) 0.5-2.5 (3) MG/3ML SOLN, Take 3 mLs by nebulization every 6 (six) hours as needed., Disp: 75 mL, Rfl: 2   montelukast (SINGULAIR) 10 MG tablet, Take 10 mg by mouth every evening., Disp: , Rfl:    Multiple Vitamins-Minerals (CENTRUM SILVER PO), Take 1 tablet by mouth daily., Disp: , Rfl:     potassium chloride SA (KLOR-CON) 20 MEQ tablet, Take 1 tablet (20 mEq total) by mouth daily., Disp: 30 tablet, Rfl: 5   vitamin C (ASCORBIC ACID) 500 MG tablet, Take 500 mg by mouth daily., Disp: , Rfl:    VITAMIN E PO, Take 1 capsule by mouth daily., Disp: , Rfl:

## 2020-10-04 LAB — PULMONARY FUNCTION TEST
DL/VA % pred: 76 %
DL/VA: 3.19 ml/min/mmHg/L
DLCO cor % pred: 64 %
DLCO cor: 11.93 ml/min/mmHg
DLCO unc % pred: 58 %
DLCO unc: 10.94 ml/min/mmHg
FEF 25-75 Post: 0.58 L/sec
FEF 25-75 Pre: 0.48 L/sec
FEF2575-%Change-Post: 20 %
FEF2575-%Pred-Post: 30 %
FEF2575-%Pred-Pre: 25 %
FEV1-%Change-Post: 5 %
FEV1-%Pred-Post: 56 %
FEV1-%Pred-Pre: 53 %
FEV1-Post: 1.23 L
FEV1-Pre: 1.16 L
FEV1FVC-%Change-Post: 2 %
FEV1FVC-%Pred-Pre: 73 %
FEV6-%Change-Post: 5 %
FEV6-%Pred-Post: 74 %
FEV6-%Pred-Pre: 71 %
FEV6-Post: 2.06 L
FEV6-Pre: 1.96 L
FEV6FVC-%Change-Post: 2 %
FEV6FVC-%Pred-Post: 101 %
FEV6FVC-%Pred-Pre: 99 %
FVC-%Change-Post: 2 %
FVC-%Pred-Post: 73 %
FVC-%Pred-Pre: 71 %
FVC-Post: 2.11 L
Post FEV1/FVC ratio: 58 %
Post FEV6/FVC ratio: 98 %
Pre FEV1/FVC ratio: 56 %
Pre FEV6/FVC Ratio: 95 %
RV % pred: 159 %
RV: 3.31 L
TLC % pred: 113 %
TLC: 5.47 L

## 2020-10-26 ENCOUNTER — Other Ambulatory Visit: Payer: Self-pay

## 2020-10-26 ENCOUNTER — Ambulatory Visit (INDEPENDENT_AMBULATORY_CARE_PROVIDER_SITE_OTHER)
Admission: RE | Admit: 2020-10-26 | Discharge: 2020-10-26 | Disposition: A | Discharge status: Home / Self Care | Payer: BC Managed Care – PPO | Source: Ambulatory Visit | Attending: Internal Medicine | Admitting: Internal Medicine

## 2020-10-26 DIAGNOSIS — R0602 Shortness of breath: Secondary | ICD-10-CM | POA: Diagnosis not present

## 2020-11-03 ENCOUNTER — Ambulatory Visit: Payer: BC Managed Care – PPO | Admitting: Internal Medicine

## 2020-11-03 ENCOUNTER — Encounter: Payer: Self-pay | Admitting: Internal Medicine

## 2020-11-03 ENCOUNTER — Other Ambulatory Visit: Payer: Self-pay

## 2020-11-03 VITALS — BP 136/70 | HR 85 | Temp 98.5°F | Ht 64.0 in | Wt 161.2 lb

## 2020-11-03 DIAGNOSIS — J449 Chronic obstructive pulmonary disease, unspecified: Secondary | ICD-10-CM | POA: Diagnosis not present

## 2020-11-03 DIAGNOSIS — Z129 Encounter for screening for malignant neoplasm, site unspecified: Secondary | ICD-10-CM | POA: Diagnosis not present

## 2020-11-03 DIAGNOSIS — Z7185 Encounter for immunization safety counseling: Secondary | ICD-10-CM

## 2020-11-03 NOTE — Patient Instructions (Addendum)
Lisa Owen, it was a pleasure to see you in clinic today.  A CT scan and pulmonary function test shows that you have COPD. You are currently on maximal treatment with Trelegy, nebulizer and as needed albuterol.  Continue taking these medications and continue to your left, large callus.  We have ordered the following tests to be evaluated scheduled for genetic consult COPD -- Alpha-1 antitrypsin phenotype  Please follow-up with [place] in 6 months if you have not relocated.

## 2020-11-03 NOTE — Progress Notes (Signed)
OV 11/03/2020  Subjective:  Patient ID: Lisa Owen, female , DOB: Mar 05, 1950 , age 70 y.o. , MRN: [id number] , ADDRESS: [address] [place] [address] PCP Blair Heys, MD Patient Care Team: Blair Heys, MD as PCP - General (Family Medicine)  This Provider for this visit: Treatment Team:  Attending Provider: Kalman Shan, MD    11/03/2020 -   Chief Complaint  Patient presents with   Follow-up    Pt had a recent CT and PFT performed and is here to day to discuss the results.  Pt states she has been doing okay since last visit and denies any real complaints.     HPI Lisa Owen 70 y.o. -presents with daughter Misty Stanley.  She was a smoker in the past but then for the last 20 years her husband has been heavily smoking inside the house.  The daughter describes the house is so smoke-filled that when people into the house from outside start coughing.  Patient has had chronic cough for 1 year with congestion.  Even talking on the phone she coughs.  Most of the time at baseline the mucus is white.  Per the daughter getting medical attention for this was in the works.  Then on August 03, 2020 she got admitted with right lower lobe haemophilus influenza pneumonia and hospital course was complicated by H. influenzae bacteremia. Review of the records indicate infectious disease consult on July 28, 2020 and discharge on August 03, 2020.  At its worst she was on 7 L and on BiPAP.  At the time of discharge she was on 3 L nasal cannula.  She was on steroids at the time of the hospitalization.  Breo was added and lisinopril was added as new discharge medications. Since discharge overall her conditioning is getting better. She was seen in the office on 7/7 and her Virgel Bouquet was changed to Trelegy.  She was prescribed DuoNeb as needed.  She was scheduled for PFT, CT chest and echocardiogram to further assess for underlying lung disease and cardiac disease.  In August 2022 the husband and  patient plan to relocate to a new house without any smoke in [place], [place]  Today, patient states she feels well.  From respiratory standpoint she does have occasional dry cough but has improved significantly since last visit.  She does have allergies and thinks this could be due to her allergies and the weather changes. She denies any recent fevers, chills, shortness of     has a past medical history of Allergy, Arrhythmia, Arthritis, Carotid bruit, hysterectomy, Hypertension, Osteopenia, and Thyroid disease.   reports that she quit smoking about 20 years ago. Her smoking use included cigarettes. She started smoking about 52 years ago. She has a 16.00 pack-year smoking history. She has been exposed to tobacco smoke. She has never used smokeless tobacco.  Past Surgical History:  Procedure Laterality Date   BREAST BIOPSY Left 04/2012   Core     Allergies  Allergen Reactions   Boniva [Ibandronic Acid]     Aches and GI upset   Clarinex [Desloratadine]    Fosamax [Alendronate Sodium]    Sudafed [Pseudoephedrine]     Hypertension Tachycardia      There is no immunization history on file for this patient.  Family History  Problem Relation Age of Onset   Arthritis Mother    Lung cancer Father    CVA Sister    Breast cancer Maternal Aunt  Current Outpatient Medications:    albuterol (VENTOLIN HFA) 108 (90 Base) MCG/ACT inhaler, Inhale 2 puffs into the lungs every 6 (six) hours as needed for wheezing or shortness of breath., Disp: 8 g, Rfl: 2   ALPRAZolam (XANAX) 0.5 MG tablet, SMARTSIG:0.5-1 Tablet(s) By Mouth 1-2 Times Daily, Disp: , Rfl:    aspirin EC 81 MG tablet, Take 81 mg by mouth daily., Disp: , Rfl:    Cholecalciferol (VITAMIN D) 50 MCG (2000 UT) CAPS, Take by mouth., Disp: , Rfl:    escitalopram (LEXAPRO) 20 MG tablet, Take 20 mg by mouth daily., Disp: , Rfl:    Fluticasone-Umeclidin-Vilant (TRELEGY ELLIPTA) 100-62.5-25 MCG/INH AEPB, Inhale 1 puff into  the lungs daily., Disp: 60 each, Rfl: 5   furosemide (LASIX) 20 MG tablet, Take 1 tablet (20 mg total) by mouth daily., Disp: 30 tablet, Rfl: 5   ipratropium-albuterol (DUONEB) 0.5-2.5 (3) MG/3ML SOLN, Take 3 mLs by nebulization every 6 (six) hours as needed., Disp: 75 mL, Rfl: 2   Multiple Vitamins-Minerals (CENTRUM SILVER PO), Take 1 tablet by mouth daily., Disp: , Rfl:    potassium chloride SA (KLOR-CON) 20 MEQ tablet, Take 1 tablet (20 mEq total) by mouth daily., Disp: 30 tablet, Rfl: 5   vitamin C (ASCORBIC ACID) 500 MG tablet, Take 500 mg by mouth daily., Disp: , Rfl:    VITAMIN E PO, Take 1 capsule by mouth daily., Disp: , Rfl:    amLODipine (NORVASC) 10 MG tablet, Take 1 tablet (10 mg total) by mouth daily. An increase from 5 mg., Disp: 30 tablet, Rfl: 2   montelukast (SINGULAIR) 10 MG tablet, Take 10 mg by mouth every evening. (Patient not taking: Reported on 11/03/2020), Disp: , Rfl:       Objective:   Vitals:   11/03/20 1439  BP: 136/70  Pulse: 85  Temp: 98.5 F (36.9 C)  TempSrc: Oral  SpO2: 95%  Weight: 161 lb 3.2 oz (73.1 kg)  Height: 5\' 4"  (1.626 m)    Estimated body mass index is 27.67 kg/m as calculated from the following:   Height as of this encounter: 5\' 4"  (1.626 m).   Weight as of this encounter: 161 lb 3.2 oz (73.1 kg).   Filed Weights   11/03/20 1439  Weight: 161 lb 3.2 oz (73.1 kg)     Physical Exam  General Appearance:    Alert, cooperative, no distress, appears stated age   Head:    Normocephalic, without obvious abnormality, atraumatic  Eyes:    PERRL, conjunctiva/corneas clear,  Ears:    Normal TM's and external ear canals, both ears  Nose:   Nares normal, septum midline, mucosa normal, no drainage    or sinus tenderness.   Throat:   Lips, mucosa, and tongue normal; teeth and gums normal.   Neck:   Supple, symmetrical, trachea midline, no adenopathy;    thyroid:  no enlargement/tenderness/nodules; no carotid   bruit or JVD  Back:      Symmetric, no curvature, ROM normal, no CVA tenderness  Lungs:   Lungs clear to auscultation bilaterally.  Normal effort.  No wheezing or rales.  Chest Wall:    No tenderness or deformity.    Heart:    Regular rate and rhythm, S1 and S2 normal, no rub   or gallop, Murmur - no  Breast Exam:    NOT DONE  Abdomen:     Soft, non-tender, bowel sounds active all four quadrants,    no masses, no organomegaly. Visceral  obesity - no  Genitalia:   NOT DONE  Rectal:   NOT DONE  Extremities: Trace bilateral lower extremity edema.  Some torturous veins in the right lower extremity.  Pulses:   2+ and symmetric all extremities  Skin:   Stigmata of Connective Tissue Disease - no  Lymph nodes:   Cervical, supraclavicular, and axillary nodes normal  Psychiatric:  Neurologic:   Pleasant, normal mood and affect  CAm-ICU - neg, Alert and Oriented x 3 - yes, Moves all 4s - yes, Speech - normal, Cognition - intact       Assessment:     No diagnosis found. COPD, Gold stage 2, Centrilobular emphysema  70 year old woman with a history of smoking, allergy, thyroid disease and hypertension here for follow-up after recent CT scan and PFT.  He is recovering from recent hospitalization for pneumonia.  CT scan shows centrilobular emphysema but no evidence of interstitial lung disease.  PFTs shows an obstructive pattern likely COPD.  Echocardiogram showed normal LVSF but grade1 diastolic dysfunction.  Respiratory symptoms stable on Trelegy and as needed DuoNebs and albuterol.  We will continue to monitor blood pressure stable lives in [place] before relocating to [place].  Vaccine counseling Patient was offered the pneumonia vaccine, flu and covid vaccine. She stated that she was hospitalized for pneumonia a few days after she received the pneumonia vaccine. She does not want the flu or covid vaccines. She was counseled to wear her mask around large crowds.     Plan:   --Alpha-1 antitrypsin  phenotype --Continue Trelegy daily --Continue DuoNebs and albuterol as needed  Patient Instructions  Mrs. Weisbecker, it was a pleasure to see you in clinic today.  A CT scan and pulmonary function test shows that you have COPD. You are currently on maximal treatment with Trelegy, nebulizer and as needed albuterol.  Continue taking these medications and continue to your left, large callus.  We have ordered the following tests to be evaluated scheduled for genetic consult COPD -- Alpha-1 antitrypsin phenotype  Please follow-up with [place] in 6 months if you have not relocated.     SIGNATURE    Dr. Kalman Shan, M.D., F.C.C.P,  Pulmonary and Critical Care Medicine Staff Physician, [facility] Center Director - Interstitial Lung Disease  Program  Pulmonary Fibrosis [facility] at [facility] [place], [place], [address]  Pager: [phone], If no answer or between  15:00h - 7:00h: call [phone] Telephone: [phone]  3:12 PM 11/03/2020

## 2020-11-03 NOTE — Progress Notes (Addendum)
OV 11/03/2020  Subjective:  Patient ID: Lisa Owen, female , DOB: March 19, 1950 , age 70 y.o. , MRN: [id number] , ADDRESS: [address] [place] [address] PCP Blair Heys, MD Patient Care Team: Blair Heys, MD as PCP - General (Family Medicine)  This Provider for this visit: Treatment Team:  Attending Provider: Kalman Shan, MD    OV 08/12/2020  Subjective:  Patient ID: Lisa Owen, female , DOB: 1950-10-27 , age 48 y.o. , MRN: [id number] , ADDRESS: [address] [place] [address] PCP Blair Heys, MD Patient Care Team: Blair Heys, MD as PCP - General (Family Medicine)  This Provider for this visit: Treatment Team:  Attending Provider: Kalman Shan, MD    08/12/2020 -   Chief Complaint  Patient presents with   Consult    Pt was admitted to the hospital for pneumonia and is coming to see [place] for a consult. Pt is still having problems with SOB and also has a cough which she will sometimes cough up phlegm. Pt also has some complaints of wheezing and chest discomfort.     HPI Lisa Owen 70 y.o. -presents with daughter Misty Stanley.  She was a smoker in the past but then for the last 20 years her husband has been heavily smoking inside the house.  The daughter describes the house is so smoke-filled that when people into the house from outside start coughing.  Patient has had chronic cough for 1 year with congestion.  Even talking on the phone she coughs.  Most of the time at baseline the mucus is white.  Per the daughter getting medical attention for this was in the works.  Then on August 03, 2020 she got admitted with right lower lobe haemophilus influenza pneumonia.  Review of the records indicate infectious disease consult on July 28, 2020 and discharge on August 03, 2020.  At its worst she was on 7 L and on BiPAP.  At the time of discharge she was on 3 L nasal cannula.  She was on steroids at the time of the hospitalization.  Breo was added and  lisinopril was added as new discharge medications.  Since discharge overall her conditioning is getting better.  Today room air at rest after 15 minutes she is 89% on room air at rest.  She says going to the doctor's office makes her more short of breath than being in the house and doing ADLs.  This is because of both exertion and anxiety associated with this.  Daughter says she still coughing quite a bit but the cough is just the baseline cough and not worse posthospitalization.  Patient feels the last few days the cough is somewhat worse but the daughter feels it is the same.  The sputum is not as colored as it was and overall the course of the sputum is improving.  The shortness of breath and physical strength is also improving.  Visualization of the chest x-ray on 07/26/2020 shows right lower lobe infiltrates no subsequent chest x-ray labs show some anemia of hospitalization but otherwise stable.  No echo was done.  She got a full course of antibiotics.  The daughter is really concerned about underlying lung disease.  She grew H. influenzae bacteremia in the hospital  There is no baseline PFTs or CT scan The husband has since quit smoking  In August 2022 the husband and patient plan to relocate to a new house without any smoke in [place], [place]   has a past medical history of Allergy, Arrhythmia, Arthritis, Carotid bruit, hysterectomy, Hypertension, Osteopenia, and Thyroid disease.   reports that she quit smoking about 20 years ago. Her smoking use included cigarettes. She started smoking about 52 years ago. She has a 16.00 pack-year smoking history. She has been exposed to tobacco smoke. She has never used smokeless tobacco.  Results for CONTESSA, PREUSS (MRN [id number]) as of 11/03/2020 15:32  Ref. Range 07/26/2020 11:46  SARSCOV2ONAVIRUS 2 AG Latest Ref Range: NEGATIVE  NEGATIVE   IMPRESSIONS     1. Left ventricular ejection fraction, by estimation, is 60 to 65%. The  left ventricle  has normal function. The left ventricle has no regional  wall motion abnormalities. Left ventricular diastolic parameters are  consistent with Grade I diastolic  dysfunction (impaired relaxation).   2. Right ventricular systolic function is normal. The right ventricular  size is normal.   3. The mitral valve is normal in structure. No evidence of mitral valve  regurgitation. No evidence of mitral stenosis.   4. The aortic valve is tricuspid. There is mild calcification of the  aortic valve. There is mild thickening of the aortic valve. Aortic valve  regurgitation is not visualized. No aortic stenosis is present.   5. The inferior vena cava is normal in size with greater than 50%  respiratory variability, suggesting right atrial pressure of 3 mmHg    09/09/2020 Follow up : Pneumonia , Dyspnea  Patient returns for a 1 month follow-up.  She was seen last visit for a pulmonary consult after recent hospitalization for haemophilus influenza pneumonia and bacteremia. Patient was admitted late June 2022 with a right lower lobe haemophilus influenza pneumonia and haemophilus influenza bacteremia.  She had acute respiratory failure and required oxygen therapy. 2D echo completed on August 25, 2020 showed EF at 60-65%, grade 1 diastolic dysfunction, right ventricular systolic function normal, right ventricular size is normal.  Last visit she was recommended to change from [facility] to Trelegy.  She does feel that this inhaler is working well.  She was also changed off of her ACE inhibitor.  Continued on Norvasc.  Her cough has improved.  Since last visit patient says she is starting to feel better more like herself.  She is having decreased shortness of breath and improved activity tolerance.  Labs last visit showed improved anemia, mildly elevated proBNP at 404, normal magnesium and phosphorus She does have some sinus drainage.Prior to admission was diagnosed with chronic bronchitis , was on Advair .  She says  about a year prior to admission she was starting to have more more breathing problems.  She was set up for pulmonary function testing unfortunately this was not completed.  She declines COVID-vaccine.  Patient education was given.  She is getting ready to move at the end of the month to [facility].  She was also discharged on oxygen.  She says that she has weaned herself off of this.  Today in the office walk test shows O2 saturations 96% walking on room air without desaturations.    11/03/2020 -   Chief Complaint  Patient presents with   Follow-up    Pt had a recent CT and PFT performed and is here to day to discuss the results.  Pt states she has been doing okay since last visit and denies any real complaints.     HPI LEYLANI DULEY 70 y.o. -presents for follow-up.  Patient was evaluated by resident and these are  the key portions of my note.  Currently she is doing well.  No infectious symptoms.  She continues on Trelegy.  She has not had an alpha-1 antitrypsin check.  She quit smoking many years ago.  She had a CT scan of the chest that does not show any ILD or cancer.  The pneumonia is resolved on the CT scan of the chest.  She had pulmonary function test that is consistent with Gold stage II COPD.  She is relocating to [facility].  I have given her the name of Dr. Donavan Burnet pulmonologist in [place].  She refused the flu shot and pneumonia shot and COVID-vaccine.  She says she got pneumonia after getting Pneumovax.  I did discuss with her she got H. influenzae after getting pneumonia vaccine for pneumococcus.  I informed her these are 2 different bacteria.  Nevertheless she does not want to take any of these vaccines  Advise masking and avoiding indoor clusters especially with sick people and doing symptom screen before getting together with people and maintaining distance.   CT Chest data 9@122   IMPRESSION: ungs/Pleura: Centrilobular emphysema. Scattered pulmonary parenchymal scarring  in the lung bases. Negative for subpleural reticulation, traction bronchiectasis/bronchiolectasis, ground-glass, architectural distortion or honeycombing. No pleural fluid. Airway is unremarkable. No air trapping. 1. No evidence of interstitial lung disease. 2. Left adrenal adenoma. 3. Aortic atherosclerosis (ICD10-I70.0). Coronary artery calcification. 4.  Emphysema (ICD10-J43.9).     Electronically Signed   By: Leanna Battles M.D.   On: 10/27/2020 09:40  No results found.    PFT  PFT Results Latest Ref Rng & Units 09/15/2020  FVC-Predicted Pre % 71  FVC-Post L 2.11  FVC-Predicted Post % 73  Pre FEV1/FVC % % 56  Post FEV1/FCV % % 58  FEV1-Pre L 1.16  FEV1-Predicted Pre % 53  FEV1-Post L 1.23  DLCO uncorrected ml/min/mmHg 10.94  DLCO UNC% % 58  DLCO corrected ml/min/mmHg 11.93  DLCO COR %Predicted % 64  DLVA Predicted % 76  TLC L 5.47  TLC % Predicted % 113  RV % Predicted % 159       has a past medical history of Allergy, Arrhythmia, Arthritis, Carotid bruit, hysterectomy, Hypertension, Osteopenia, and Thyroid disease.   reports that she quit smoking about 20 years ago. Her smoking use included cigarettes. She started smoking about 52 years ago. She has a 16.00 pack-year smoking history. She has been exposed to tobacco smoke. She has never used smokeless tobacco.  Past Surgical History:  Procedure Laterality Date   BREAST BIOPSY Left 04/2012   Core     Allergies  Allergen Reactions   Boniva [Ibandronic Acid]     Aches and GI upset   Clarinex [Desloratadine]    Fosamax [Alendronate Sodium]    Sudafed [Pseudoephedrine]     Hypertension Tachycardia      There is no immunization history on file for this patient.  Family History  Problem Relation Age of Onset   Arthritis Mother    Lung cancer Father    CVA Sister    Breast cancer Maternal Aunt      Current Outpatient Medications:    albuterol (VENTOLIN HFA) 108 (90 Base) MCG/ACT inhaler, Inhale  2 puffs into the lungs every 6 (six) hours as needed for wheezing or shortness of breath., Disp: 8 g, Rfl: 2   ALPRAZolam (XANAX) 0.5 MG tablet, SMARTSIG:0.5-1 Tablet(s) By Mouth 1-2 Times Daily, Disp: , Rfl:    aspirin EC 81 MG tablet, Take 81  mg by mouth daily., Disp: , Rfl:    Cholecalciferol (VITAMIN D) 50 MCG (2000 UT) CAPS, Take by mouth., Disp: , Rfl:    escitalopram (LEXAPRO) 20 MG tablet, Take 20 mg by mouth daily., Disp: , Rfl:    Fluticasone-Umeclidin-Vilant (TRELEGY ELLIPTA) 100-62.5-25 MCG/INH AEPB, Inhale 1 puff into the lungs daily., Disp: 60 each, Rfl: 5   furosemide (LASIX) 20 MG tablet, Take 1 tablet (20 mg total) by mouth daily., Disp: 30 tablet, Rfl: 5   ipratropium-albuterol (DUONEB) 0.5-2.5 (3) MG/3ML SOLN, Take 3 mLs by nebulization every 6 (six) hours as needed., Disp: 75 mL, Rfl: 2   Multiple Vitamins-Minerals (CENTRUM SILVER PO), Take 1 tablet by mouth daily., Disp: , Rfl:    potassium chloride SA (KLOR-CON) 20 MEQ tablet, Take 1 tablet (20 mEq total) by mouth daily., Disp: 30 tablet, Rfl: 5   vitamin C (ASCORBIC ACID) 500 MG tablet, Take 500 mg by mouth daily., Disp: , Rfl:    VITAMIN E PO, Take 1 capsule by mouth daily., Disp: , Rfl:    amLODipine (NORVASC) 10 MG tablet, Take 1 tablet (10 mg total) by mouth daily. An increase from 5 mg., Disp: 30 tablet, Rfl: 2   montelukast (SINGULAIR) 10 MG tablet, Take 10 mg by mouth every evening. (Patient not taking: Reported on 11/03/2020), Disp: , Rfl:    There is no immunization history on file for this patient.     Objective:   Vitals:   11/03/20 1439  BP: 136/70  Pulse: 85  Temp: 98.5 F (36.9 C)  TempSrc: Oral  SpO2: 95%  Weight: 161 lb 3.2 oz (73.1 kg)  Height: 5\' 4"  (1.626 m)    Estimated body mass index is 27.67 kg/m as calculated from the following:   Height as of this encounter: 5\' 4"  (1.626 m).   Weight as of this encounter: 161 lb 3.2 oz (73.1 kg).  @WEIGHTCHANGE @    11/03/20 1439   Weight: 161 lb 3.2 oz (73.1 kg)     Physical Exam clear to auscultation no oral thrush alert and oriented x3.     Assessment:       ICD-10-CM   1. Moderate COPD (chronic obstructive pulmonary disease) (HCC)  J44.9 Alpha-1 antitrypsin phenotype    2. Cancer screening  Z12.9     3. Vaccine counseling  Z71.85          Plan:     Patient Instructions  Mrs. Stehle, it was a pleasure to see you in clinic today.  A CT scan and pulmonary function test shows that you have COPD. You are currently on maximal treatment with Trelegy, nebulizer and as needed albuterol.  Continue taking these medications and continue to your left, large callus.  We have ordered the following tests to be evaluated scheduled for genetic consult COPD -- Alpha-1 antitrypsin phenotype  Please follow-up with [employer] in 6 months if you have not relocated.    SIGNATURE    Dr. 11/05/20, M.D., F.C.C.P,  Pulmonary and Critical Care Medicine Staff Physician, [facility] Center Director - Interstitial Lung Disease  Program  Pulmonary Fibrosis [facility] at [facility], [facility], [facility]  Pager: [phone], If no answer or between  15:00h - 7:00h: call [phone] Telephone: [phone]  4:12 PM 11/03/2020

## 2020-11-03 NOTE — Addendum Note (Signed)
Addended by: Kalman Shan on: 11/03/2020 04:12 PM   Modules accepted: Level of Service

## 2020-11-18 LAB — ALPHA-1 ANTITRYPSIN PHENOTYPE: A-1 Antitrypsin, Ser: 169 mg/dL (ref 83–199)

## 2021-02-01 ENCOUNTER — Other Ambulatory Visit: Payer: Self-pay | Admitting: Internal Medicine

## 2021-03-02 ENCOUNTER — Other Ambulatory Visit: Payer: Self-pay | Admitting: Internal Medicine

## 2021-03-21 ENCOUNTER — Other Ambulatory Visit: Payer: Self-pay | Admitting: Internal Medicine

## 2021-03-21 ENCOUNTER — Telehealth: Payer: Self-pay | Admitting: Internal Medicine

## 2021-03-22 NOTE — Telephone Encounter (Signed)
I called the patient and she is going to check at the pharmacy for her refills. She did not have any questions. I see that a refill was sent 03/21/21. She will call back if she has any problems.

## 2021-07-27 ENCOUNTER — Other Ambulatory Visit: Payer: Self-pay | Admitting: Internal Medicine

## 2021-08-04 ENCOUNTER — Other Ambulatory Visit (HOSPITAL_COMMUNITY): Payer: Self-pay

## 2021-08-04 ENCOUNTER — Telehealth: Payer: Self-pay

## 2021-08-04 NOTE — Telephone Encounter (Signed)
Patient Advocate Encounter   Received notification that prior authorization for Trelegy Ellipta 100-62.5-25MCG/ACT aerosol powder is required.   PA submitted on 08/04/2021 Key [id number] Status is pending

## 2021-08-10 ENCOUNTER — Other Ambulatory Visit (HOSPITAL_COMMUNITY): Payer: Self-pay

## 2021-08-16 ENCOUNTER — Other Ambulatory Visit (HOSPITAL_COMMUNITY): Payer: Self-pay

## 2021-08-16 NOTE — Telephone Encounter (Signed)
PA cancelled. No PA needed. Saved fax from [facility].

## 2021-09-14 ENCOUNTER — Other Ambulatory Visit: Payer: Self-pay | Admitting: Internal Medicine

## 2021-09-14 NOTE — Telephone Encounter (Signed)
Pt was last seen last year. Pt was in process of moving. She has moved to [facility]. Per her chart she has not established a new Pulmonologist. She is asking for a refill on her Furosamide. Please advise if you are ok with this refill.

## 2021-11-25 IMAGING — CT CT CHEST HIGH RESOLUTION W/O CM
2 of 9 series · 14 of 36 positions shown, 17 images · non-contrast
Comparison: None.

CLINICAL DATA: Shortness of breath on exertion. Pneumonia and
hospitalization in [REDACTED].

EXAM:
CT CHEST WITHOUT CONTRAST
TECHNIQUE: Multidetector CT imaging of the chest was performed following the
standard protocol without intravenous contrast. High resolution
imaging of the lungs, as well as inspiratory and expiratory imaging,
was performed.

[Series 6: high resolution · axial · 0.65mm/px · z∈[-324,-58]mm · 11 of 320 slices shown, 14 images]
[im 27/320  mediastinal]
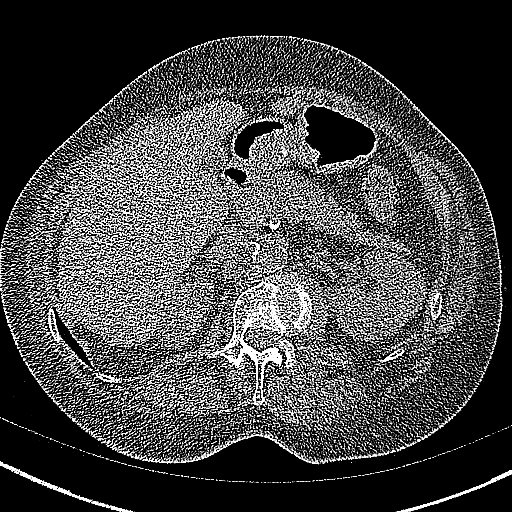
[im 27/320  lung]
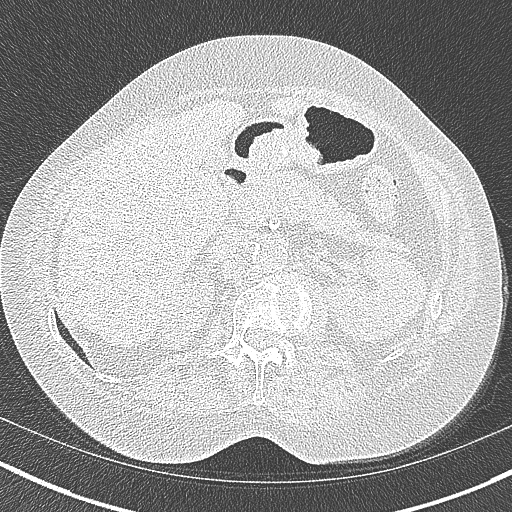
[im 54/320  lung]
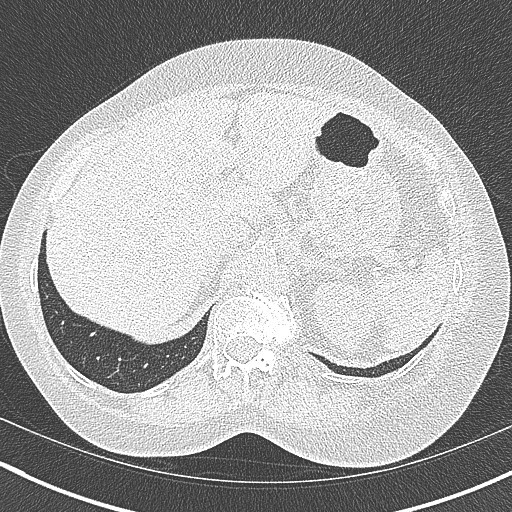
[im 80/320  lung]
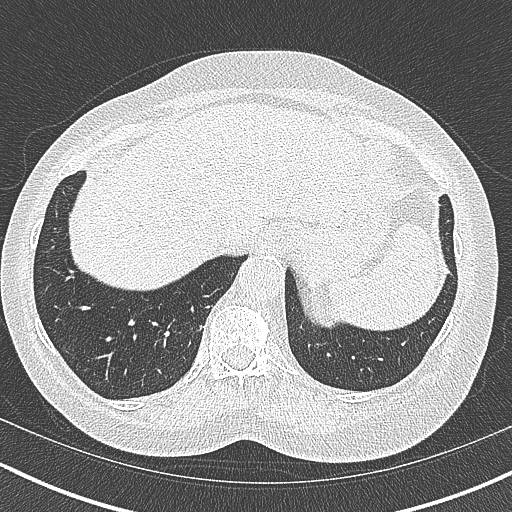
[im 107/320  lung]
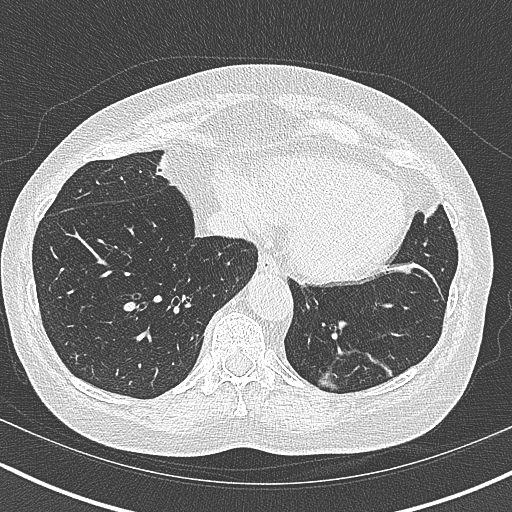
[im 133/320  mediastinal]
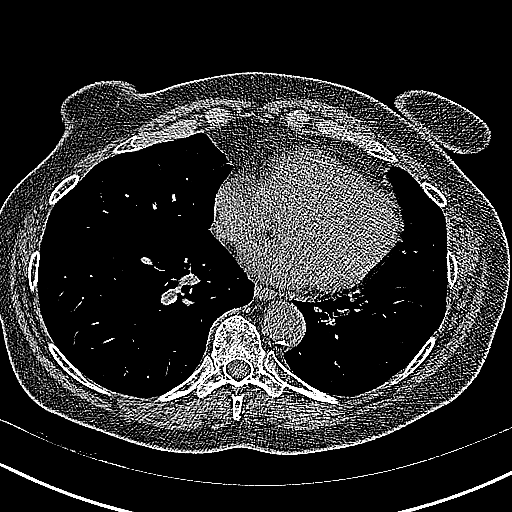
[im 133/320  lung]
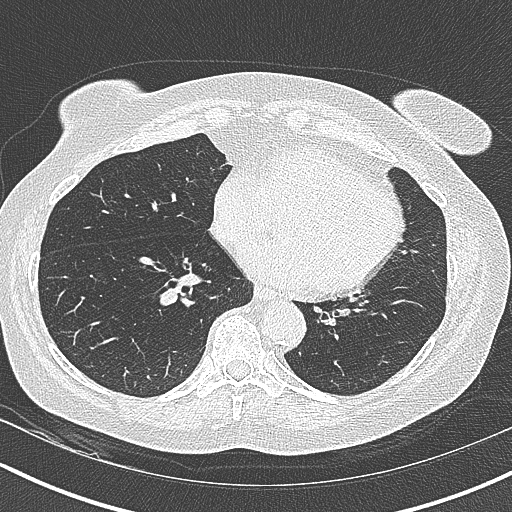
[im 160/320  lung]
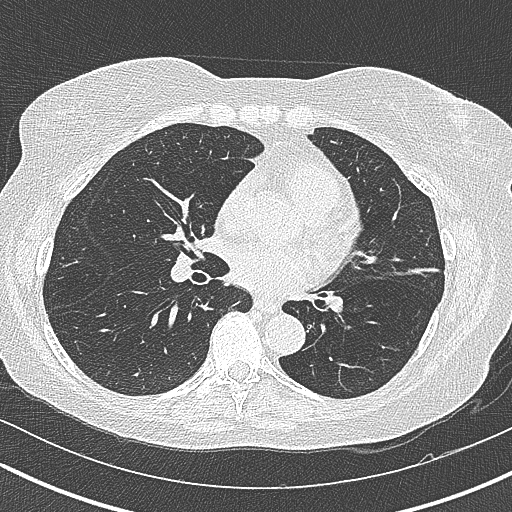
[im 187/320  lung]
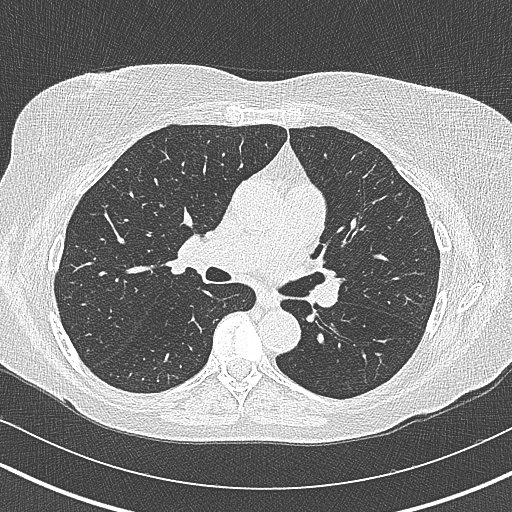
[im 213/320  lung]
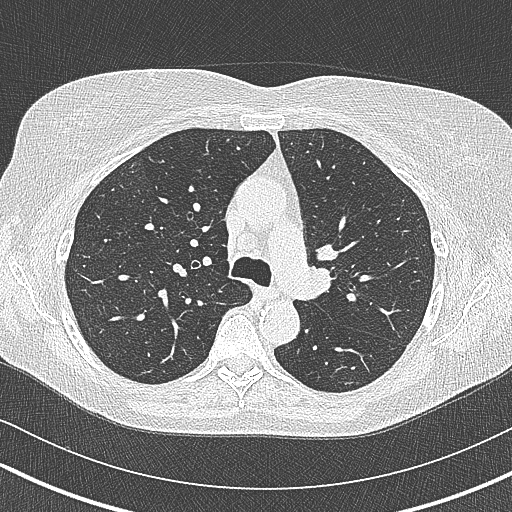
[im 240/320  mediastinal]
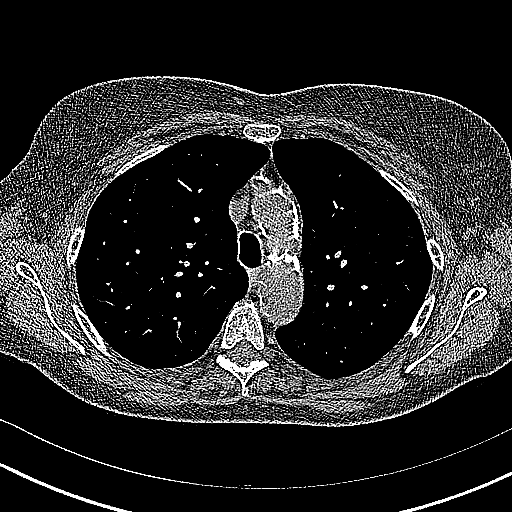
[im 240/320  lung]
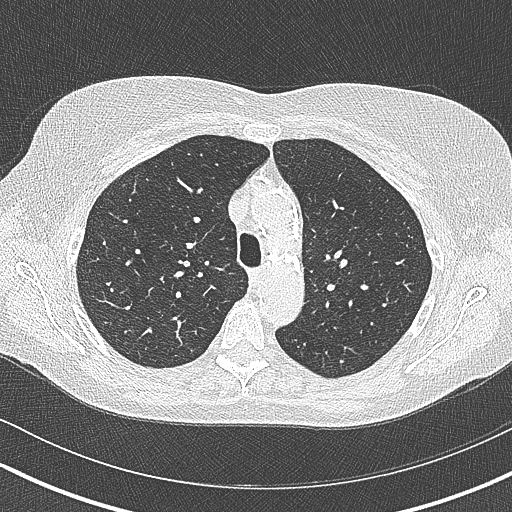
[im 266/320  lung]
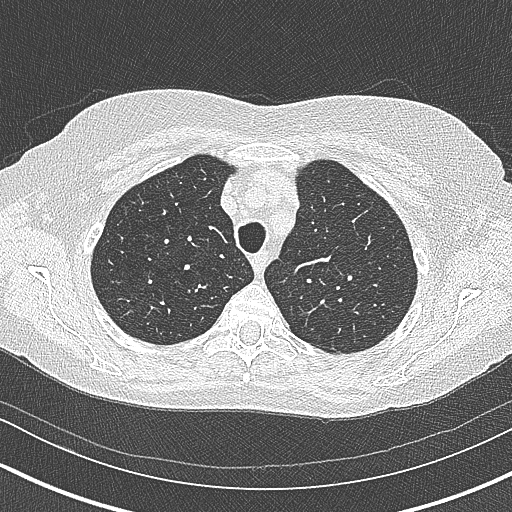
[im 293/320  lung]
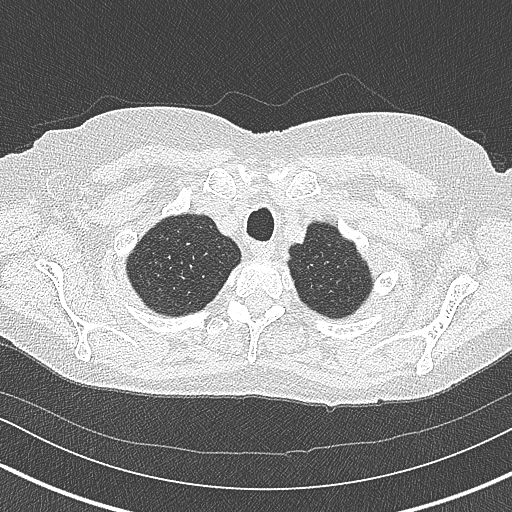

[Series 14: coronal · coronal · 0.64mm/px · 3 of 126 slices shown]
[im 26/126  lung]
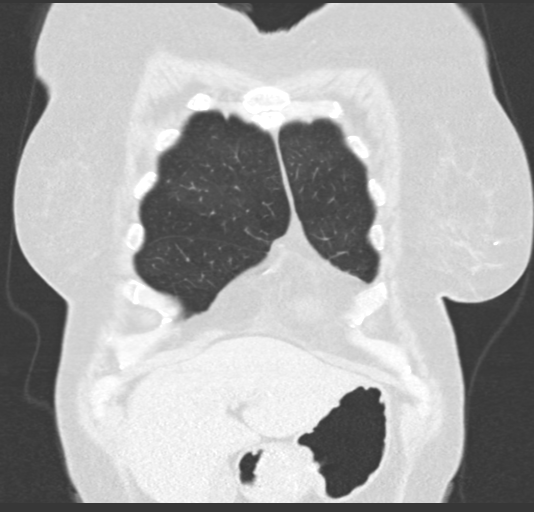
[im 51/126  lung]
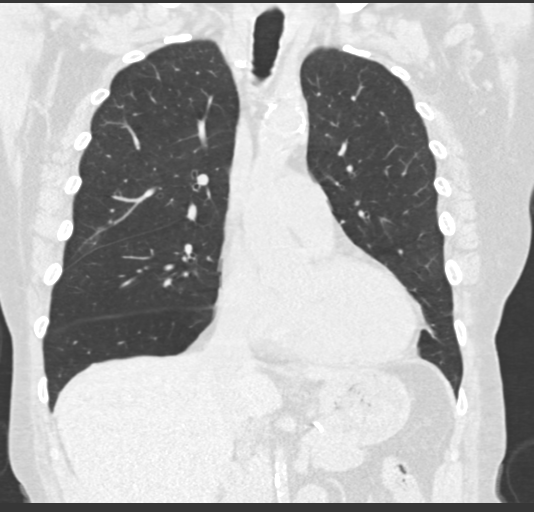
[im 76/126  lung]
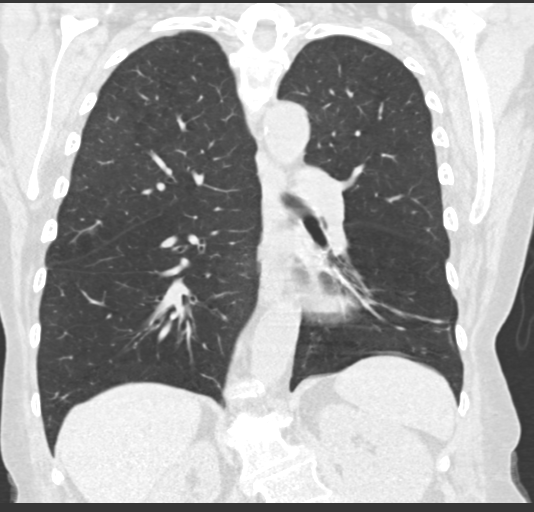

[14 of 36 positions shown; findings below may reference images not displayed]

FINDINGS: Cardiovascular: Atherosclerotic calcification of the aorta, aortic
valve and coronary arteries. Heart is at the upper limits of normal
in size. No pericardial effusion.

Mediastinum/Nodes: Mediastinal lymph nodes are not enlarged by CT
size criteria. Hilar regions are difficult to evaluate without IV
contrast. No axillary adenopathy. Esophagus is grossly unremarkable.

Lungs/Pleura: Centrilobular emphysema. Scattered pulmonary
parenchymal scarring in the lung bases. Negative for subpleural
reticulation, traction bronchiectasis/bronchiolectasis,
ground-glass, architectural distortion or honeycombing. No pleural
fluid. Airway is unremarkable. No air trapping.

Upper Abdomen: 8 mm low-attenuation lesion in the periphery of the
right hepatic lobe, incompletely imaged. Visualized portions of the
liver, gallbladder and right adrenal gland are unremarkable. Fluid
density left adrenal nodule measures 2.1 cm. Visualized portions of
the kidneys, spleen, pancreas, stomach bowel are otherwise grossly
unremarkable.

Musculoskeletal: Degenerative changes in the spine. No worrisome
lytic or sclerotic lesions.
IMPRESSION: 1. No evidence of interstitial lung disease.
2. Left adrenal adenoma.
3. Aortic atherosclerosis ([id number]). Coronary artery
calcification.
4.  Emphysema ([id number]).

## 2022-01-19 ENCOUNTER — Other Ambulatory Visit: Payer: Self-pay | Admitting: Internal Medicine
